# Patient Record
Sex: Female | Born: 1965 | Race: White | Hispanic: No | Marital: Married | Smoking: Never smoker
Health system: Southern US, Community
[De-identification: ages and names within clinical notes are randomized; demographics above are authoritative.]

## PROBLEM LIST (undated history)

## (undated) DIAGNOSIS — F419 Anxiety disorder, unspecified: Secondary | ICD-10-CM

## (undated) HISTORY — DX: Anxiety disorder, unspecified: F41.9

## (undated) HISTORY — PX: TOE SURGERY: SHX1073

## (undated) HISTORY — PX: CERVICAL BIOPSY: SHX590

---

## 2007-10-09 ENCOUNTER — Ambulatory Visit: Payer: Self-pay | Admitting: Obstetrics and Gynecology

## 2009-03-08 ENCOUNTER — Ambulatory Visit: Payer: Self-pay | Admitting: Obstetrics and Gynecology

## 2011-09-18 ENCOUNTER — Ambulatory Visit: Payer: Self-pay | Admitting: Obstetrics and Gynecology

## 2012-09-25 ENCOUNTER — Encounter: Payer: Self-pay | Admitting: Family Medicine

## 2012-09-25 ENCOUNTER — Ambulatory Visit (INDEPENDENT_AMBULATORY_CARE_PROVIDER_SITE_OTHER): Payer: BC Managed Care – PPO | Admitting: Family Medicine

## 2012-09-25 ENCOUNTER — Telehealth: Payer: Self-pay

## 2012-09-25 VITALS — BP 100/60 | HR 80 | Temp 97.9°F | Ht 66.25 in | Wt 171.0 lb

## 2012-09-25 DIAGNOSIS — Z136 Encounter for screening for cardiovascular disorders: Secondary | ICD-10-CM

## 2012-09-25 DIAGNOSIS — F4323 Adjustment disorder with mixed anxiety and depressed mood: Secondary | ICD-10-CM

## 2012-09-25 DIAGNOSIS — Z1231 Encounter for screening mammogram for malignant neoplasm of breast: Secondary | ICD-10-CM

## 2012-09-25 DIAGNOSIS — Z0000 Encounter for general adult medical examination without abnormal findings: Secondary | ICD-10-CM

## 2012-09-25 LAB — CBC WITH DIFFERENTIAL/PLATELET
Basophils Relative: 0.4 % (ref 0.0–3.0)
Eosinophils Relative: 0.8 % (ref 0.0–5.0)
HCT: 37.9 % (ref 36.0–46.0)
Hemoglobin: 12.9 g/dL (ref 12.0–15.0)
Lymphs Abs: 1.3 10*3/uL (ref 0.7–4.0)
MCV: 84.8 fl (ref 78.0–100.0)
Monocytes Absolute: 0.4 10*3/uL (ref 0.1–1.0)
Neutro Abs: 3.6 10*3/uL (ref 1.4–7.7)
Platelets: 273 10*3/uL (ref 150.0–400.0)
RBC: 4.47 Mil/uL (ref 3.87–5.11)
WBC: 5.3 10*3/uL (ref 4.5–10.5)

## 2012-09-25 LAB — COMPREHENSIVE METABOLIC PANEL
ALT: 19 U/L (ref 0–35)
CO2: 26 mEq/L (ref 19–32)
Calcium: 9.2 mg/dL (ref 8.4–10.5)
Chloride: 107 mEq/L (ref 96–112)
Creatinine, Ser: 0.8 mg/dL (ref 0.4–1.2)
GFR: 86.57 mL/min (ref >60.00)

## 2012-09-25 LAB — LIPID PANEL: HDL: 48.1 mg/dL (ref >39.00)

## 2012-09-25 LAB — T4, FREE: Free T4: 0.82 ng/dL (ref 0.60–1.60)

## 2012-09-25 MED ORDER — CITALOPRAM HYDROBROMIDE 20 MG PO TABS: ORAL_TABLET | ORAL | Status: DC

## 2012-09-25 NOTE — Patient Instructions (Addendum)
It was nice to meet you. Please call to set up your mammogram.  I will call you with your lab results or you can view them online.  We are starting celexa (citalopram).  Please call me with an update in a few weeks.

## 2012-09-25 NOTE — Telephone Encounter (Signed)
Okey Regal pharmacist with [facility] called to confirm pt is to d/c lexapro and start citalopram; advised carol that was correct.Okey Regal voiced understanding.

## 2012-09-25 NOTE — Progress Notes (Signed)
Subjective:    Patient ID: Misty Rogers, female    DOB: 18-Oct-1965, 47 y.o.   MRN: [id number]  HPI  Very pleasant 47 yo G2P2 here to establish care.  "Moodiness"- started on lexapro 10 mg daily last year by OBYN for being more snappy with people.  She denies any depression.  Does feel more anxious.  Stopped taking it a few weeks ago because she felt it was causing weight gain.  Also endorses sexual dysfunction.  Still having periods but they have become a little more irregular.  Has two teenagers at home.  Husband supportive.  Due for mammogram.  No h/o abnormal pap smears in past 5 years.  Last pap smear June 2013.   Patient Active Problem List   Diagnosis Date Noted  . Adjustment disorder with mixed anxiety and depressed mood 09/25/2012  . Routine general medical examination at a health care facility 09/25/2012  . Screening for ischemic heart disease 09/25/2012   Past Medical History  Diagnosis Date  . Anxiety    No past surgical history on file. History  Substance Use Topics  . Smoking status: Never Smoker   . Smokeless tobacco: Not on file  . Alcohol Use: Not on file   Family History  Problem Relation Age of Onset  . Hypertension Father   . Seizures Father   . Leukemia Father   . Cancer Maternal Aunt 60    ovarian   No Known Allergies No current outpatient prescriptions on file prior to visit.   No current facility-administered medications on file prior to visit.   The PMH, PSH, Social History, Family History, Medications, and allergies have been reviewed in [facility], and have been updated if relevant.     Review of Systems    See HPI Denies changes in her bowels Feels like she cannot get control over her weight- wants to become more active +difficulty achieving orgasm  Objective:   Physical Exam BP 100/60  Pulse 80  Temp(Src) 97.9 F (36.6 C)  Ht 5' 6.25" (1.683 m)  Wt 171 lb (77.565 kg)  BMI 27.38 kg/m2  General:   Well-developed,well-nourished,in no acute distress; alert,appropriate and cooperative throughout examination Head:  normocephalic and atraumatic.   Eyes:  vision grossly intact, pupils equal, pupils round, and pupils reactive to light.   Ears:  R ear normal and L ear normal.   Nose:  no external deformity.   Mouth:  good dentition.   Lungs:  Normal respiratory effort, chest expands symmetrically. Lungs are clear to auscultation, no crackles or wheezes. Heart:  Normal rate and regular rhythm. S1 and S2 normal without gallop, murmur, click, rub or other extra sounds. Abdomen:  Bowel sounds positive,abdomen soft and non-tender without masses, organomegaly or hernias noted. Msk:  No deformity or scoliosis noted of thoracic or lumbar spine.   Extremities:  No clubbing, cyanosis, edema, or deformity noted with normal full range of motion of all joints.   Neurologic:  alert & oriented X3 and gait normal.   Skin:  Intact without suspicious lesions or rashes Cervical Nodes:  No lymphadenopathy noted Axillary Nodes:  No palpable lymphadenopathy Psych:  Cognition and judgment appear intact. Alert and cooperative with normal attention span and concentration. No apparent delusions, illusions, hallucinations     Assessment & Plan:  1. Adjustment disorder with mixed anxiety and depressed mood Discussed work up and different tx options. Any SSRI has potential to cause weight gain and or sexual dysfunction but we can certainly try another medication.  She already stopped lexapro.  Will start citalopram 20 mg daily. Will also check labs today to rule out other possible contributing factors.  - Luteinizing hormone - Follicle Stimulating Hormone - TSH - T4, Free - Comprehensive metabolic panel - CBC with Differential  2. Screening for ischemic heart disease  - Lipid Panel  3. Other screening mammogram  - MM Digital Screening; Future

## 2012-09-26 ENCOUNTER — Other Ambulatory Visit: Payer: Self-pay | Admitting: Family Medicine

## 2012-09-26 MED ORDER — LEVOTHYROXINE SODIUM 25 MCG PO TABS: 25.0000 ug | ORAL_TABLET | Freq: Every day | ORAL | Status: DC

## 2012-10-07 ENCOUNTER — Ambulatory Visit: Payer: Self-pay | Admitting: Family Medicine

## 2012-10-08 ENCOUNTER — Encounter: Payer: Self-pay | Admitting: Family Medicine

## 2012-10-09 ENCOUNTER — Encounter: Payer: Self-pay | Admitting: *Deleted

## 2012-10-22 ENCOUNTER — Ambulatory Visit: Payer: Self-pay | Admitting: Family Medicine

## 2012-10-23 ENCOUNTER — Other Ambulatory Visit: Payer: BC Managed Care – PPO

## 2012-10-23 ENCOUNTER — Other Ambulatory Visit (INDEPENDENT_AMBULATORY_CARE_PROVIDER_SITE_OTHER): Payer: BC Managed Care – PPO

## 2012-10-23 DIAGNOSIS — R946 Abnormal results of thyroid function studies: Secondary | ICD-10-CM

## 2012-10-23 DIAGNOSIS — R7989 Other specified abnormal findings of blood chemistry: Secondary | ICD-10-CM

## 2012-10-28 ENCOUNTER — Encounter: Payer: Self-pay | Admitting: *Deleted

## 2012-10-28 ENCOUNTER — Other Ambulatory Visit: Payer: Self-pay | Admitting: *Deleted

## 2012-10-28 MED ORDER — LEVOTHYROXINE SODIUM 25 MCG PO TABS: 25.0000 ug | ORAL_TABLET | Freq: Every day | ORAL | Status: DC

## 2013-01-05 ENCOUNTER — Telehealth: Payer: Self-pay | Admitting: *Deleted

## 2013-01-05 NOTE — Telephone Encounter (Signed)
Yes, change to 1 tab daily, # 30, 2 refills please

## 2013-01-05 NOTE — Telephone Encounter (Signed)
Received faxed refill request from pharmacy for Citalopram 20 mg, should script be changed to one tablet daily? Last office visit 09/25/12. Is it okay to refill medication?

## 2013-01-06 MED ORDER — CITALOPRAM HYDROBROMIDE 20 MG PO TABS: ORAL_TABLET | ORAL | Status: DC

## 2013-01-06 NOTE — Telephone Encounter (Signed)
Refill sent to pharmacy as instructed. 

## 2013-05-06 ENCOUNTER — Other Ambulatory Visit: Payer: Self-pay | Admitting: Internal Medicine

## 2013-05-06 NOTE — Telephone Encounter (Signed)
Last filled 12/2012--pleaser advise

## 2013-05-07 NOTE — Telephone Encounter (Signed)
Dr. A pt 

## 2013-06-08 ENCOUNTER — Encounter: Payer: Self-pay | Admitting: Family Medicine

## 2013-06-08 ENCOUNTER — Ambulatory Visit (INDEPENDENT_AMBULATORY_CARE_PROVIDER_SITE_OTHER): Payer: BC Managed Care – PPO | Admitting: Family Medicine

## 2013-06-08 VITALS — BP 106/68 | HR 67 | Temp 98.0°F | Ht 66.0 in | Wt 170.5 lb

## 2013-06-08 DIAGNOSIS — M722 Plantar fascial fibromatosis: Secondary | ICD-10-CM

## 2013-06-08 NOTE — Progress Notes (Signed)
Pre visit review using our clinic review tool, if applicable. No additional management support is needed unless otherwise documented below in the visit note. 

## 2013-06-08 NOTE — Patient Instructions (Signed)
Please read handouts on Plantar Fascitis.  STRETCHING and Strengthening program critically important.  Strengthening on foot and calf muscles as seen in handout. Calf raises, 2 legged, then 1 legged. Foot massage with tennis ball. Ice massage.  NEEDS TO BE DONE EVERY DAY  Recommended over the counter insoles. (Spenco or Hapad)  A rigid shoe with good arch support helps: Dansko (great), Jennet Maduro, Merrell No easily bendable shoes.

## 2013-06-08 NOTE — Progress Notes (Signed)
Date:  06/08/2013   Name:  Misty Rogers   DOB:  22-Dec-1965   MRN:  [id number]  PCP:  Arnette Norris, MD   This 48 y.o. female patient presents with a 1 year long history of R heel pain. This is notable for worsening pain first thing in the morning when arising and standing after sitting.   R sided heel pain - 1 year.  No injury.  Bothers with getting up or walking a lot.   Prior foot or ankle fractures: none Prior operations: none Orthotics or bracing: none Medications: none PT or home rehab: none Night splints: no Ice massage: no Ball massage: no  Metatarsal pain: no  The PMH, PSH, Social History, Family History, Medications, and allergies have been reviewed in [facility], and have been updated if relevant.  REVIEW OF SYSTEMS  GEN: No fevers, chills. Nontoxic. Primarily MSK c/o today. MSK: Detailed in the HPI GI: tolerating PO intake without difficulty Neuro: No numbness, parasthesias, or tingling associated. Otherwise the pertinent positives of the ROS are noted above.   PHYSICAL EXAM  Blood pressure 106/68, pulse 67, temperature 98 F (36.7 C), temperature source Oral, height 5\' 6"  (1.676 m), weight 170 lb 8 oz (77.338 kg), SpO2 97.00%.  GEN: Well-developed,well-nourished,in no acute distress; alert,appropriate and cooperative throughout examination HEENT: Normocephalic and atraumatic without obvious abnormalities. Ears, externally no deformities PULM: Breathing comfortably in no respiratory distress EXT: No clubbing, cyanosis, or edema PSYCH: Normally interactive. Cooperative during the interview. Pleasant. Friendly and conversant. Not anxious or depressed appearing. Normal, full affect.  R Echymosis: no Edema: no ROM: full LE B Gait: heel toe, non-antalgic MT pain: no Callus pattern: none Lateral Mall: NT Medial Mall: NT Talus: NT Navicular: NT Calcaneous: NT Metatarsals: NT 5th MT: NT Phalanges: NT Achilles: NT Plantar Fascia: tender, medial along PF. Pain  with forced dorsi Fat Pad: NT Peroneals: NT Post Tib: NT Great Toe: Nml motion Ant Drawer: neg Other foot breakdown: none Long arch: natural cavus foot with some breakdown now Transverse arch: bunion and bunionette formation, notable breakdown B Hindfoot breakdown: none Sensation: intact  A/P: Plantar fascitis: R  >25 minutes spent in face to face time with patient, >50% spent in counselling or coordination of care  We reviewed that stretching is critically important to the treatment of PF. Reviewed footwear. Rigid soles have been shown to help with PF.  Reviewed rehab of stretching and calf raises.  Reviewed rehab from [facility]  Could benefit from a corticosteroid injection if conservative treatment fails.  New Prescriptions   No medications on file    No orders of the defined types were placed in this encounter.    Patient Instructions: Please read handouts on Plantar Fascitis.  STRETCHING and Strengthening program critically important.  Strengthening on foot and calf muscles as seen in handout. Calf raises, 2 legged, then 1 legged. Foot massage with tennis ball. Ice massage.  NEEDS TO BE DONE EVERY DAY  Recommended over the counter insoles. (Spenco or Hapad)  A rigid shoe with good arch support helps: Dansko (great), Jennet Maduro, Merrell No easily bendable shoes.    Signed,  Maud Deed. Brayton Baumgartner, MD, [facility] [place] [address] Phone: [phone] Fax: [phone]   Patient's Medications  New Prescriptions   No medications on file  Previous Medications   CITALOPRAM (CELEXA) 20 MG TABLET    TAKE 1 TABLET BY MOUTH ONCE  DAILY.   LEVOTHYROXINE (SYNTHROID, LEVOTHROID) 25 MCG TABLET    Take 1 tablet (25 mcg total) by mouth daily before breakfast.  Modified Medications   No medications on file  Discontinued Medications   No medications on file

## 2013-12-21 ENCOUNTER — Other Ambulatory Visit: Payer: Self-pay

## 2013-12-21 MED ORDER — CITALOPRAM HYDROBROMIDE 20 MG PO TABS: ORAL_TABLET | ORAL | Status: DC

## 2013-12-21 NOTE — Telephone Encounter (Signed)
Pt left /vm requesting refill citalopram to [facility]; pt last seen by Dr Deborra Medina 09/25/12 and Dr Lorelei Pont saw pt plantar fasciitis on 06/08/13. Pt has appt scheduled on 01/20/14.Please advise.

## 2014-01-20 ENCOUNTER — Ambulatory Visit (INDEPENDENT_AMBULATORY_CARE_PROVIDER_SITE_OTHER): Payer: BC Managed Care – PPO | Admitting: Family Medicine

## 2014-01-20 ENCOUNTER — Encounter: Payer: Self-pay | Admitting: Family Medicine

## 2014-01-20 ENCOUNTER — Other Ambulatory Visit (HOSPITAL_COMMUNITY)
Admission: RE | Admit: 2014-01-20 | Discharge: 2014-01-20 | Disposition: A | Discharge status: Home / Self Care | Payer: BC Managed Care – PPO | Source: Ambulatory Visit | Attending: Family Medicine | Admitting: Family Medicine

## 2014-01-20 VITALS — BP 118/68 | HR 67 | Temp 98.1°F | Ht 66.25 in | Wt 177.8 lb

## 2014-01-20 DIAGNOSIS — Z1151 Encounter for screening for human papillomavirus (HPV): Secondary | ICD-10-CM | POA: Insufficient documentation

## 2014-01-20 DIAGNOSIS — F4323 Adjustment disorder with mixed anxiety and depressed mood: Secondary | ICD-10-CM

## 2014-01-20 DIAGNOSIS — Z01419 Encounter for gynecological examination (general) (routine) without abnormal findings: Secondary | ICD-10-CM | POA: Diagnosis not present

## 2014-01-20 DIAGNOSIS — E039 Hypothyroidism, unspecified: Secondary | ICD-10-CM | POA: Insufficient documentation

## 2014-01-20 DIAGNOSIS — Z0000 Encounter for general adult medical examination without abnormal findings: Secondary | ICD-10-CM

## 2014-01-20 DIAGNOSIS — Z1239 Encounter for other screening for malignant neoplasm of breast: Secondary | ICD-10-CM

## 2014-01-20 LAB — T4, FREE: Free T4: 0.78 ng/dL (ref 0.60–1.60)

## 2014-01-20 LAB — COMPREHENSIVE METABOLIC PANEL
ALBUMIN: 4.1 g/dL (ref 3.5–5.2)
ALT: 19 U/L (ref 0–35)
AST: 21 U/L (ref 0–37)
Alkaline Phosphatase: 60 U/L (ref 39–117)
BUN: 11 mg/dL (ref 6–23)
CO2: 24 meq/L (ref 19–32)
Calcium: 9.1 mg/dL (ref 8.4–10.5)
Chloride: 103 mEq/L (ref 96–112)
Creatinine, Ser: 0.8 mg/dL (ref 0.4–1.2)
GFR: 83.55 mL/min (ref >60.00)
GLUCOSE: 76 mg/dL (ref 70–99)
POTASSIUM: 3.8 meq/L (ref 3.5–5.1)
SODIUM: 138 meq/L (ref 135–145)
TOTAL PROTEIN: 7.1 g/dL (ref 6.0–8.3)
Total Bilirubin: 0.6 mg/dL (ref 0.2–1.2)

## 2014-01-20 LAB — CBC WITH DIFFERENTIAL/PLATELET
Basophils Absolute: 0 10*3/uL (ref 0.0–0.1)
Basophils Relative: 0.3 % (ref 0.0–3.0)
EOS PCT: 0.5 % (ref 0.0–5.0)
Eosinophils Absolute: 0 10*3/uL (ref 0.0–0.7)
HEMATOCRIT: 38.8 % (ref 36.0–46.0)
Hemoglobin: 12.9 g/dL (ref 12.0–15.0)
Lymphocytes Relative: 29.2 % (ref 12.0–46.0)
Lymphs Abs: 1.5 10*3/uL (ref 0.7–4.0)
MCHC: 33.2 g/dL (ref 30.0–36.0)
MCV: 85.5 fl (ref 78.0–100.0)
Monocytes Absolute: 0.4 10*3/uL (ref 0.1–1.0)
Monocytes Relative: 8.3 % (ref 3.0–12.0)
NEUTROS PCT: 61.7 % (ref 43.0–77.0)
Neutro Abs: 3.2 10*3/uL (ref 1.4–7.7)
PLATELETS: 316 10*3/uL (ref 150.0–400.0)
RBC: 4.53 Mil/uL (ref 3.87–5.11)
RDW: 13.2 % (ref 11.5–15.5)
WBC: 5.2 10*3/uL (ref 4.0–10.5)

## 2014-01-20 LAB — LIPID PANEL
CHOLESTEROL: 201 mg/dL — AB (ref 0–200)
HDL: 45.9 mg/dL (ref >39.00)
LDL Cholesterol: 124 mg/dL — ABNORMAL HIGH (ref 0–99)
NonHDL: 155.1
Total CHOL/HDL Ratio: 4
Triglycerides: 155 mg/dL — ABNORMAL HIGH (ref 0.0–149.0)
VLDL: 31 mg/dL (ref 0.0–40.0)

## 2014-01-20 LAB — TSH: TSH: 2.97 u[IU]/mL (ref 0.35–4.50)

## 2014-01-20 MED ORDER — LEVOTHYROXINE SODIUM 25 MCG PO TABS: 25.0000 ug | ORAL_TABLET | Freq: Every day | ORAL | Status: DC

## 2014-01-20 NOTE — Progress Notes (Signed)
Pre visit review using our clinic review tool, if applicable. No additional management support is needed unless otherwise documented below in the visit note. 

## 2014-01-20 NOTE — Assessment & Plan Note (Signed)
Well controlled on Celexa. No changes made today.

## 2014-01-20 NOTE — Patient Instructions (Signed)
Great to see you. Happy Thanksgiving. We will call you with your results from today.  Please call to set up your mammogram.

## 2014-01-20 NOTE — Addendum Note (Signed)
Addended by: Modena Nunnery on: 01/20/2014 10:40 AM   Modules accepted: Orders

## 2014-01-20 NOTE — Assessment & Plan Note (Signed)
Recheck thyroid function today.

## 2014-01-20 NOTE — Progress Notes (Signed)
Subjective:   Patient ID: Misty Rogers, female    DOB: May 03, 1965, 48 y.o.   MRN: [id number]  Misty Rogers is a pleasant 48 y.o. year old female who I have not seen since she established care with me in 08/2012,  presents to clinic today with Annual Exam  on 01/20/2014  HPI:   No h/o abnormal pap smears in past 5 years. Last pap smear June 2013. Last mammogram 10/07/12. Has been having irregular periods. Has already received flu shot.  When she established care with me last summer, she was having symptoms of anxiety.  Had stopped taking Lexapro prescribed by her GYN due to weight gain and sexual dysfunction.  We started her on celexa 20 mg daily. Has not noticed those symptoms on Celexa. Wt Readings from Last 3 Encounters:  01/20/14 177 lb 12 oz (80.627 kg)  06/08/13 170 lb 8 oz (77.338 kg)  09/25/12 171 lb (77.565 kg)    Hypothyroidism- supposed to take synthroid 25 mcg daily but often forgets dose. Has noticed some fatigue but she thinks that is more due to perimenopause. Lab Results  Component Value Date   TSH 2.27 10/23/2012    Lab Results  Component Value Date   CHOL 182 09/25/2012   HDL 48.10 09/25/2012   LDLCALC 110* 09/25/2012   TRIG 118.0 09/25/2012   CHOLHDL 4 09/25/2012   Lab Results  Component Value Date   WBC 5.3 09/25/2012   HGB 12.9 09/25/2012   HCT 37.9 09/25/2012   MCV 84.8 09/25/2012   PLT 273.0 09/25/2012   Lab Results  Component Value Date   CREATININE 0.8 09/25/2012   Lab Results  Component Value Date   TSH 2.27 10/23/2012    Current Outpatient Prescriptions on File Prior to Visit  Medication Sig Dispense Refill  . citalopram (CELEXA) 20 MG tablet TAKE 1 TABLET BY MOUTH ONCE DAILY. 30 tablet 0   No current facility-administered medications on file prior to visit.    No Known Allergies  Past Medical History  Diagnosis Date  . Anxiety     No past surgical history on file.  Family History  Problem Relation Age of Onset    . Hypertension Father   . Seizures Father   . Leukemia Father   . Cancer Maternal Aunt 60    ovarian    History   Social History  . Marital Status: Married    Spouse Name: N/A    Number of Children: N/A  . Years of Education: N/A   Occupational History  . Not on file.   Social History Main Topics  . Smoking status: Never Smoker   . Smokeless tobacco: Not on file  . Alcohol Use: Not on file  . Drug Use: Not on file  . Sexual Activity: Not on file   Other Topics Concern  . Not on file   Social History Narrative   Teacher   Married, two children.   The PMH, PSH, Social History, Family History, Medications, and allergies have been reviewed in [facility], and have been updated if relevant.  Review of Systems See HPI Patient reports no  vision/ hearing changes,anorexia, weight change, fever ,adenopathy, persistant / recurrent hoarseness, swallowing issues, chest pain, edema,persistant / recurrent cough, hemoptysis, dyspnea(rest, exertional, paroxysmal nocturnal), gastrointestinal  bleeding (melena, rectal bleeding), abdominal pain, excessive heart burn, GU symptoms(dysuria, hematuria, pyuria, voiding/incontinence  Issues) syncope, focal weakness, severe memory loss, concerning skin lesions, depression, anxiety, abnormal bruising/bleeding, major joint swelling, breast masses or abnormal vaginal  bleeding.       Objective:    BP 118/68 mmHg  Pulse 67  Temp(Src) 98.1 F (36.7 C) (Oral)  Ht 5' 6.25" (1.683 m)  Wt 177 lb 12 oz (80.627 kg)  BMI 28.47 kg/m2  SpO2 99%  LMP 12/30/2013 (Within Days)   Physical Exam    General:  Well-developed,well-nourished,in no acute distress; alert,appropriate and cooperative throughout examination Head:  normocephalic and atraumatic.   Eyes:  vision grossly intact, pupils equal, pupils round, and pupils reactive to light.   Ears:  R ear normal and L ear normal.   Nose:  no external deformity.   Mouth:  good dentition.   Neck:  No  deformities, masses, or tenderness noted. Breasts:  No mass, nodules, thickening, tenderness, bulging, retraction, inflamation, nipple discharge or skin changes noted.   Lungs:  Normal respiratory effort, chest expands symmetrically. Lungs are clear to auscultation, no crackles or wheezes. Heart:  Normal rate and regular rhythm. S1 and S2 normal without gallop, murmur, click, rub or other extra sounds. Abdomen:  Bowel sounds positive,abdomen soft and non-tender without masses, organomegaly or hernias noted. Rectal:  no external abnormalities.   Genitalia:  Pelvic Exam:        External: normal female genitalia without lesions or masses        Vagina: normal without lesions or masses        Cervix: normal without lesions or masses        Adnexa: normal bimanual exam without masses or fullness        Uterus: normal by palpation        Pap smear: performed Msk:  No deformity or scoliosis noted of thoracic or lumbar spine.   Extremities:  No clubbing, cyanosis, edema, or deformity noted with normal full range of motion of all joints.   Neurologic:  alert & oriented X3 and gait normal.   Skin:  Intact without suspicious lesions or rashes Cervical Nodes:  No lymphadenopathy noted Axillary Nodes:  No palpable lymphadenopathy Psych:  Cognition and judgment appear intact. Alert and cooperative with normal attention span and concentration. No apparent delusions, illusions, hallucinations      Assessment & Plan:   Routine general medical examination at a health care facility  Hypothyroidism, unspecified hypothyroidism type No Follow-up on file.

## 2014-01-20 NOTE — Assessment & Plan Note (Signed)
Reviewed preventive care protocols, scheduled due services, and updated immunizations Discussed nutrition, exercise, diet, and healthy lifestyle.  Pap smear done today.  Orders Placed This Encounter  Procedures  . MM Digital Screening  . CBC with Differential  . Comprehensive metabolic panel  . Lipid panel  . TSH  . T4, Free

## 2014-01-25 ENCOUNTER — Encounter: Payer: Self-pay | Admitting: *Deleted

## 2014-01-25 LAB — CYTOLOGY - PAP

## 2014-01-27 ENCOUNTER — Other Ambulatory Visit: Payer: Self-pay | Admitting: *Deleted

## 2014-01-27 MED ORDER — CITALOPRAM HYDROBROMIDE 20 MG PO TABS: ORAL_TABLET | ORAL | Status: DC

## 2014-03-03 ENCOUNTER — Ambulatory Visit (INDEPENDENT_AMBULATORY_CARE_PROVIDER_SITE_OTHER): Payer: BC Managed Care – PPO | Admitting: Family Medicine

## 2014-03-03 ENCOUNTER — Encounter: Payer: Self-pay | Admitting: Family Medicine

## 2014-03-03 VITALS — BP 100/70 | HR 69 | Temp 98.8°F | Wt 180.5 lb

## 2014-03-03 DIAGNOSIS — J069 Acute upper respiratory infection, unspecified: Secondary | ICD-10-CM

## 2014-03-03 DIAGNOSIS — J029 Acute pharyngitis, unspecified: Secondary | ICD-10-CM

## 2014-03-03 LAB — POCT RAPID STREP A (OFFICE): Rapid Strep A Screen: NEGATIVE

## 2014-03-03 MED ORDER — FLUTICASONE PROPIONATE 50 MCG/ACT NA SUSP: 2.0000 | Freq: Every day | NASAL | Status: DC

## 2014-03-03 MED ORDER — BENZONATATE 200 MG PO CAPS: 200.0000 mg | ORAL_CAPSULE | Freq: Three times a day (TID) | ORAL | Status: DC | PRN

## 2014-03-03 NOTE — Patient Instructions (Signed)
Gargle with salt water, use tessalon for the cough and use flonase for the runny nose.  Gently try to pop your ears.  Take care. Glad to see you.

## 2014-03-03 NOTE — Progress Notes (Signed)
Sx started about 8 days ago.  Cough initially.  Malaise.  ST.  HA.  Fatigue.  ST continues now.  Ear pressure.  Sleep disrupted.  Cough at night.  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   ST HA and nocturnal cough are most bothersome.  HA is frontal, not maxillary.  Bilateral.    Meds, vitals, and allergies reviewed.   ROS: See HPI.  Otherwise, noncontributory.  GEN: nad, alert and oriented HEENT: mucous membranes moist, tm w/o erythema, nasal exam w/o erythema, clear discharge noted,  OP with cobblestoning, sinuses not ttp x4 NECK: supple w/o LA CV: rrr.   PULM: ctab, no inc wob EXT: no edema SKIN: no acute rash L ETD on exam.  She is able to clear the R TM.   RST neg.

## 2014-03-04 ENCOUNTER — Ambulatory Visit: Payer: Self-pay | Admitting: Internal Medicine

## 2014-03-04 DIAGNOSIS — J069 Acute upper respiratory infection, unspecified: Secondary | ICD-10-CM | POA: Insufficient documentation

## 2014-03-04 NOTE — Assessment & Plan Note (Signed)
Likely viral, dw pt.  Use tessalon for cough, add on flonase. F/u prn.  She agrees.

## 2014-04-27 ENCOUNTER — Other Ambulatory Visit: Payer: Self-pay | Admitting: *Deleted

## 2014-04-27 MED ORDER — CITALOPRAM HYDROBROMIDE 20 MG PO TABS: ORAL_TABLET | ORAL | Status: DC

## 2014-06-25 ENCOUNTER — Other Ambulatory Visit: Payer: Self-pay | Admitting: Family Medicine

## 2014-06-25 DIAGNOSIS — Z1231 Encounter for screening mammogram for malignant neoplasm of breast: Secondary | ICD-10-CM

## 2014-06-29 ENCOUNTER — Ambulatory Visit (INDEPENDENT_AMBULATORY_CARE_PROVIDER_SITE_OTHER): Payer: BC Managed Care – PPO | Admitting: Family Medicine

## 2014-06-29 ENCOUNTER — Encounter: Payer: Self-pay | Admitting: Family Medicine

## 2014-06-29 ENCOUNTER — Ambulatory Visit: Payer: BC Managed Care – PPO | Admitting: Family Medicine

## 2014-06-29 VITALS — BP 122/82 | HR 68 | Temp 98.0°F | Wt 185.8 lb

## 2014-06-29 DIAGNOSIS — B029 Zoster without complications: Secondary | ICD-10-CM

## 2014-06-29 MED ORDER — VALACYCLOVIR HCL 1 G PO TABS: 1000.0000 mg | ORAL_TABLET | Freq: Three times a day (TID) | ORAL | Status: DC

## 2014-06-29 NOTE — Progress Notes (Signed)
Subjective:   Patient ID: Misty Rogers, female    DOB: August 09, 1965, 49 y.o.   MRN: [id number]  Misty Rogers is a pleasant 49 y.o. year old female who presents to clinic today with Follow-up  on 06/29/2014  HPI:  End of last week, felt some sharp pain lateral border of her left breast.  She thought maybe she pulled something.  On Saturday, in same area and wrapped around to her left back, developed blistery rash. Went to [employer] in Clinic- diagnosed with shingles.  Brings in bottles of rxs she was given.  Given Valtrex 1 gram twice daily x 7 days along with Tramadol.  Has not noticed any weeping.  Still painful and burning. No fevers.  Tries not to take Tramadol since she still has to work and it made her sleepy.  Has never had shingles in past.  Does not feel like she has new stressors in her life, has not been recently sick.  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  Lab Results  Component Value Date   WBC 5.2 01/20/2014   HGB 12.9 01/20/2014   HCT 38.8 01/20/2014   MCV 85.5 01/20/2014   PLT 316.0 01/20/2014   Current Outpatient Prescriptions on File Prior to Visit  Medication Sig Dispense Refill  . citalopram (CELEXA) 20 MG tablet TAKE 1 TABLET BY MOUTH ONCE DAILY. 30 tablet 7   No current facility-administered medications on file prior to visit.    No Known Allergies  Past Medical History  Diagnosis Date  . Anxiety     No past surgical history on file.  Family History  Problem Relation Age of Onset  . Hypertension Father   . Seizures Father   . Leukemia Father   . Cancer Maternal Aunt 60    ovarian    History   Social History  . Marital Status: Married    Spouse Name: N/A  . Number of Children: N/A  . Years of Education: N/A   Occupational History  . Not on file.   Social History Main Topics  . Smoking status: Never Smoker   . Smokeless tobacco: Never Used  . Alcohol Use: No  . Drug Use: No  . Sexual  Activity: Not on file   Other Topics Concern  . Not on file   Social History Narrative   Teacher   Married, two children.   The PMH, PSH, Social History, Family History, Medications, and allergies have been reviewed in [facility], and have been updated if relevant.   Review of Systems  Constitutional: Negative.   HENT: Negative.   Respiratory: Negative.   Cardiovascular: Negative.   Gastrointestinal: Negative.   Skin: Negative.   Neurological: Negative.   Hematological: Negative.   Psychiatric/Behavioral: Negative.   All other systems reviewed and are negative.      Objective:    BP 122/82 mmHg  Pulse 68  Temp(Src) 98 F (36.7 C) (Oral)  Wt 185 lb 12 oz (84.256 kg)  SpO2 98%   Physical Exam  Constitutional: She is oriented to person, place, and time. She appears well-developed and well-nourished. No distress.  HENT:  Head: Normocephalic.  Eyes: Conjunctivae are normal.  Cardiovascular: Normal rate.   Pulmonary/Chest: Effort normal.  Musculoskeletal: She exhibits no edema.  Neurological: She is alert and oriented to person, place, and time. No cranial nerve deficit.  Skin: Skin is warm and dry.     Psychiatric: She has a normal  mood and affect. Her behavior is normal. Judgment and thought content normal.  Nursing note and vitals reviewed.         Assessment & Plan:   Herpes zoster No Follow-up on file.

## 2014-06-29 NOTE — Progress Notes (Signed)
Pre visit review using our clinic review tool, if applicable. No additional management support is needed unless otherwise documented below in the visit note. 

## 2014-06-29 NOTE — Patient Instructions (Signed)

## 2014-06-29 NOTE — Assessment & Plan Note (Signed)
>  25 minutes spent in face to face time with patient, >50% spent in counselling or coordination of care Agree with diagnosis of shingles but I prefer to increase her valtrex to 1 gram three times daily for the remainder of her course. Call or return to clinic prn if these symptoms worsen or fail to improve as anticipated. The patient indicates understanding of these issues and agrees with the plan.

## 2014-07-13 ENCOUNTER — Ambulatory Visit
Admission: RE | Admit: 2014-07-13 | Discharge: 2014-07-13 | Disposition: A | Discharge status: Home / Self Care | Payer: BC Managed Care – PPO | Source: Ambulatory Visit | Attending: Family Medicine | Admitting: Family Medicine

## 2014-07-13 DIAGNOSIS — Z1231 Encounter for screening mammogram for malignant neoplasm of breast: Secondary | ICD-10-CM

## 2014-11-29 ENCOUNTER — Encounter: Payer: Self-pay | Admitting: Family Medicine

## 2014-11-29 ENCOUNTER — Ambulatory Visit (INDEPENDENT_AMBULATORY_CARE_PROVIDER_SITE_OTHER): Payer: BC Managed Care – PPO | Admitting: Family Medicine

## 2014-11-29 VITALS — BP 132/62 | HR 67 | Temp 97.6°F | Wt 191.2 lb

## 2014-11-29 DIAGNOSIS — Z23 Encounter for immunization: Secondary | ICD-10-CM | POA: Diagnosis not present

## 2014-11-29 DIAGNOSIS — N951 Menopausal and female climacteric states: Secondary | ICD-10-CM | POA: Diagnosis not present

## 2014-11-29 NOTE — Progress Notes (Signed)
   Subjective:   Patient ID: Misty Rogers, female    DOB: 1966-01-28, 49 y.o.   MRN: [id number]  Misty Rogers is a pleasant 49 y.o. year old female who presents to clinic today with Hot Flashes and [place]  on 11/29/2014  HPI:  Has not had a period in 10 months.  Over past 10 months, progressively more frequent and more severe hot flashes.  Her face and neck get red and warm and she "breaks out in all over sweat."  She is taking her celexa regularly.  Not yet having issues with vaginal dryness.  She is having some insomnia, no mood swings.  Current Outpatient Prescriptions on File Prior to Visit  Medication Sig Dispense Refill  . citalopram (CELEXA) 20 MG tablet TAKE 1 TABLET BY MOUTH ONCE DAILY. 30 tablet 7   No current facility-administered medications on file prior to visit.    No Known Allergies  Past Medical History  Diagnosis Date  . Anxiety     History reviewed. No pertinent past surgical history.  Family History  Problem Relation Age of Onset  . Hypertension Father   . Seizures Father   . Leukemia Father   . Cancer Maternal Aunt 2    ovarian    Social History   Social History  . Marital Status: Married    Spouse Name: N/A  . Number of Children: N/A  . Years of Education: N/A   Occupational History  . Not on file.   Social History Main Topics  . Smoking status: Never Smoker   . Smokeless tobacco: Never Used  . Alcohol Use: No  . Drug Use: No  . Sexual Activity: Not on file   Other Topics Concern  . Not on file   Social History Narrative   Teacher   Married, two children.   The PMH, PSH, Social History, Family History, Medications, and allergies have been reviewed in [facility], and have been updated if relevant.   Review of Systems  Gastrointestinal: Negative.   Endocrine: Positive for heat intolerance.  Genitourinary: Positive for menstrual problem. Negative for dysuria, urgency, hematuria, decreased urine volume, vaginal bleeding,  vaginal discharge, vaginal pain, pelvic pain and dyspareunia.  Neurological: Negative.   Psychiatric/Behavioral: Positive for sleep disturbance. Negative for suicidal ideas, hallucinations, behavioral problems, confusion, self-injury, dysphoric mood, decreased concentration and agitation. The patient is not nervous/anxious and is not hyperactive.   All other systems reviewed and are negative.      Objective:    BP 132/62 mmHg  Pulse 67  Temp(Src) 97.6 F (36.4 C) (Oral)  Wt 191 lb 4 oz (86.75 kg)  SpO2 98%   Physical Exam  Constitutional: She is oriented to person, place, and time. She appears well-developed and well-nourished. No distress.  HENT:  Head: Normocephalic and atraumatic.  Cardiovascular: Normal rate.   Pulmonary/Chest: Effort normal.  Musculoskeletal: Normal range of motion.  Neurological: She is alert and oriented to person, place, and time. No cranial nerve deficit.  Skin: Skin is warm and dry.  Psychiatric: She has a normal mood and affect. Her behavior is normal. Judgment and thought content normal.  Nursing note and vitals reviewed.         Assessment & Plan:   Need for influenza vaccination - Plan: Flu Vaccine QUAD 36+ mos PF IM (Fluarix & Fluzone Quad PF)  Peri-menopause No Follow-up on file.

## 2014-11-29 NOTE — Assessment & Plan Note (Signed)
New- >25 minutes spent in face to face time with patient, >50% spent in counselling or coordination of care Discussed tx options.  At this point, we agreed to check FSH/LH levels to see if she is menopausal.  She wants to know these results before considering rxs. She is already taking celexa, we could certainly try another SSRI ( wean off celexa) for vasomotor symptoms and eventually possible consider HRT. The patient indicates understanding of these issues and agrees with the plan.

## 2014-11-29 NOTE — Progress Notes (Signed)
Pre visit review using our clinic review tool, if applicable. No additional management support is needed unless otherwise documented below in the visit note. 

## 2014-11-30 ENCOUNTER — Telehealth: Payer: Self-pay | Admitting: Family Medicine

## 2014-11-30 LAB — LUTEINIZING HORMONE: LH: 32.9 m[IU]/mL

## 2014-11-30 LAB — TSH: TSH: 6.19 u[IU]/mL — AB (ref 0.35–4.50)

## 2014-11-30 LAB — FOLLICLE STIMULATING HORMONE: FSH: 63.8 m[IU]/mL

## 2014-11-30 NOTE — Telephone Encounter (Signed)
Patient returned Waynetta's call. °

## 2014-12-01 ENCOUNTER — Other Ambulatory Visit: Payer: Self-pay | Admitting: Family Medicine

## 2014-12-01 MED ORDER — LEVOTHYROXINE SODIUM 25 MCG PO TABS: 25.0000 ug | ORAL_TABLET | Freq: Every day | ORAL | Status: DC

## 2014-12-01 NOTE — Telephone Encounter (Signed)
See results note. 

## 2014-12-22 ENCOUNTER — Other Ambulatory Visit: Payer: Self-pay | Admitting: *Deleted

## 2014-12-22 ENCOUNTER — Other Ambulatory Visit: Payer: Self-pay | Admitting: Family Medicine

## 2014-12-22 DIAGNOSIS — Z01419 Encounter for gynecological examination (general) (routine) without abnormal findings: Secondary | ICD-10-CM | POA: Insufficient documentation

## 2014-12-22 DIAGNOSIS — Z136 Encounter for screening for cardiovascular disorders: Secondary | ICD-10-CM

## 2014-12-22 DIAGNOSIS — E039 Hypothyroidism, unspecified: Secondary | ICD-10-CM

## 2014-12-22 MED ORDER — CITALOPRAM HYDROBROMIDE 20 MG PO TABS: ORAL_TABLET | ORAL | Status: DC

## 2014-12-30 ENCOUNTER — Other Ambulatory Visit: Payer: BC Managed Care – PPO

## 2015-01-12 ENCOUNTER — Other Ambulatory Visit: Payer: BC Managed Care – PPO

## 2015-01-13 ENCOUNTER — Other Ambulatory Visit: Payer: BC Managed Care – PPO

## 2015-01-26 ENCOUNTER — Other Ambulatory Visit (INDEPENDENT_AMBULATORY_CARE_PROVIDER_SITE_OTHER): Payer: BC Managed Care – PPO

## 2015-01-26 DIAGNOSIS — E039 Hypothyroidism, unspecified: Secondary | ICD-10-CM | POA: Diagnosis not present

## 2015-01-26 DIAGNOSIS — Z0000 Encounter for general adult medical examination without abnormal findings: Secondary | ICD-10-CM | POA: Diagnosis not present

## 2015-01-26 DIAGNOSIS — Z01419 Encounter for gynecological examination (general) (routine) without abnormal findings: Secondary | ICD-10-CM

## 2015-01-26 DIAGNOSIS — Z136 Encounter for screening for cardiovascular disorders: Secondary | ICD-10-CM | POA: Diagnosis not present

## 2015-01-27 LAB — COMPREHENSIVE METABOLIC PANEL
ALK PHOS: 97 U/L (ref 39–117)
ALT: 44 U/L — ABNORMAL HIGH (ref 0–35)
AST: 23 U/L (ref 0–37)
Albumin: 4.2 g/dL (ref 3.5–5.2)
BUN: 18 mg/dL (ref 6–23)
CHLORIDE: 101 meq/L (ref 96–112)
CO2: 28 mEq/L (ref 19–32)
Calcium: 9.4 mg/dL (ref 8.4–10.5)
Creatinine, Ser: 0.86 mg/dL (ref 0.40–1.20)
GFR: 74.33 mL/min (ref >60.00)
GLUCOSE: 82 mg/dL (ref 70–99)
POTASSIUM: 4 meq/L (ref 3.5–5.1)
Sodium: 138 mEq/L (ref 135–145)
TOTAL PROTEIN: 7.3 g/dL (ref 6.0–8.3)
Total Bilirubin: 0.3 mg/dL (ref 0.2–1.2)

## 2015-01-27 LAB — CBC WITH DIFFERENTIAL/PLATELET
BASOS PCT: 0.8 % (ref 0.0–3.0)
Basophils Absolute: 0.1 10*3/uL (ref 0.0–0.1)
EOS PCT: 0.9 % (ref 0.0–5.0)
Eosinophils Absolute: 0.1 10*3/uL (ref 0.0–0.7)
HCT: 39.1 % (ref 36.0–46.0)
Hemoglobin: 12.9 g/dL (ref 12.0–15.0)
LYMPHS ABS: 2.1 10*3/uL (ref 0.7–4.0)
Lymphocytes Relative: 27.9 % (ref 12.0–46.0)
MCHC: 33.1 g/dL (ref 30.0–36.0)
MCV: 84.5 fl (ref 78.0–100.0)
MONO ABS: 0.6 10*3/uL (ref 0.1–1.0)
MONOS PCT: 7.6 % (ref 3.0–12.0)
NEUTROS ABS: 4.8 10*3/uL (ref 1.4–7.7)
NEUTROS PCT: 62.8 % (ref 43.0–77.0)
Platelets: 301 10*3/uL (ref 150.0–400.0)
RBC: 4.62 Mil/uL (ref 3.87–5.11)
RDW: 13.1 % (ref 11.5–15.5)
WBC: 7.6 10*3/uL (ref 4.0–10.5)

## 2015-01-27 LAB — LIPID PANEL
CHOL/HDL RATIO: 5
CHOLESTEROL: 220 mg/dL — AB (ref 0–200)
HDL: 44.5 mg/dL (ref >39.00)
LDL Cholesterol: 138 mg/dL — ABNORMAL HIGH (ref 0–99)
NonHDL: 175.14
TRIGLYCERIDES: 185 mg/dL — AB (ref 0.0–149.0)
VLDL: 37 mg/dL (ref 0.0–40.0)

## 2015-01-27 LAB — TSH: TSH: 3.17 u[IU]/mL (ref 0.35–4.50)

## 2015-01-27 LAB — T4, FREE: FREE T4: 0.71 ng/dL (ref 0.60–1.60)

## 2015-01-31 LAB — HIV ANTIBODY (ROUTINE TESTING W REFLEX): HIV: NONREACTIVE

## 2015-02-02 ENCOUNTER — Telehealth: Payer: Self-pay | Admitting: Family Medicine

## 2015-02-02 NOTE — Telephone Encounter (Signed)
Pt called questioning the last message that was left regarding cancelled appts.  I explained her past appts, and pt made appt for cpe but she still wants further explanation of the message you lfet.  cb number is [phone] Thank you

## 2015-02-03 NOTE — Telephone Encounter (Signed)
Spoke to pt and advised that she is needing a CPE, which has been scheduled.

## 2015-02-15 ENCOUNTER — Encounter: Payer: Self-pay | Admitting: Family Medicine

## 2015-02-15 ENCOUNTER — Ambulatory Visit (INDEPENDENT_AMBULATORY_CARE_PROVIDER_SITE_OTHER): Payer: BC Managed Care – PPO | Admitting: Family Medicine

## 2015-02-15 VITALS — BP 110/78 | HR 76 | Temp 98.0°F | Ht 66.5 in | Wt 193.5 lb

## 2015-02-15 DIAGNOSIS — F418 Other specified anxiety disorders: Secondary | ICD-10-CM

## 2015-02-15 DIAGNOSIS — Z01419 Encounter for gynecological examination (general) (routine) without abnormal findings: Secondary | ICD-10-CM

## 2015-02-15 DIAGNOSIS — F419 Anxiety disorder, unspecified: Secondary | ICD-10-CM

## 2015-02-15 DIAGNOSIS — F329 Major depressive disorder, single episode, unspecified: Secondary | ICD-10-CM | POA: Insufficient documentation

## 2015-02-15 DIAGNOSIS — Z0000 Encounter for general adult medical examination without abnormal findings: Secondary | ICD-10-CM | POA: Diagnosis not present

## 2015-02-15 DIAGNOSIS — E039 Hypothyroidism, unspecified: Secondary | ICD-10-CM | POA: Diagnosis not present

## 2015-02-15 NOTE — Patient Instructions (Signed)
Great to see you. Happy Holidays!  Please try to cut back on fried and fatty foods after the holidays.

## 2015-02-15 NOTE — Assessment & Plan Note (Signed)
Reviewed preventive care protocols, scheduled due services, and updated immunizations Discussed nutrition, exercise, diet, and healthy lifestyle.  

## 2015-02-15 NOTE — Assessment & Plan Note (Signed)
Well controlled on current dose of synthroid. No changes made today. 

## 2015-02-15 NOTE — Progress Notes (Signed)
Subjective:   Patient ID: Misty Rogers, female    DOB: [birth year], 49 y.o.   MRN: [id number]  Misty Rogers is a pleasant 49 y.o. year old female presents to clinic today with Annual Exam  on 02/15/2015  HPI:   No h/o abnormal pap smears in past 5 years.  Last  Pap smear 01/20/14- done by me. Has been having irregular periods. Has already received flu shot. Mammogram 07/14/2014   Wt Readings from Last 3 Encounters:  02/15/15 193 lb 8 oz (87.771 kg)  11/29/14 191 lb 4 oz (86.75 kg)  06/29/14 185 lb 12 oz (84.256 kg)   Anxiety/depression-  Feeling symptoms of  anxiety and depression are well controlled on celexa 20 mg daily.   Hypothyroidism- has been better about remembering to take her  synthroid 25 mcg daily. Denies any symptoms of hypo or hyperthyroidism. Lab Results  Component Value Date   TSH 3.17 01/27/2015    Lab Results  Component Value Date   CHOL 220* 01/27/2015   HDL 44.50 01/27/2015   LDLCALC 138* 01/27/2015   TRIG 185.0* 01/27/2015   CHOLHDL 5 01/27/2015   Lab Results  Component Value Date   WBC 7.6 01/27/2015   HGB 12.9 01/27/2015   HCT 39.1 01/27/2015   MCV 84.5 01/27/2015   PLT 301.0 01/27/2015   Lab Results  Component Value Date   CREATININE 0.86 01/27/2015   Lab Results  Component Value Date   TSH 3.17 01/27/2015    Current Outpatient Prescriptions on File Prior to Visit  Medication Sig Dispense Refill  . citalopram (CELEXA) 20 MG tablet TAKE 1 TABLET BY MOUTH ONCE DAILY. 30 tablet 3  . levothyroxine (SYNTHROID, LEVOTHROID) 25 MCG tablet Take 1 tablet (25 mcg total) by mouth daily before breakfast. 30 tablet 1   No current facility-administered medications on file prior to visit.    No Known Allergies  Past Medical History  Diagnosis Date  . Anxiety     No past surgical history on file.  Family History  Problem Relation Age of Onset  . Hypertension Father   . Seizures Father   . Leukemia Father   . Cancer  Maternal Aunt 38    ovarian    Social History   Social History  . Marital Status: Married    Spouse Name: N/A  . Number of Children: N/A  . Years of Education: N/A   Occupational History  . Not on file.   Social History Main Topics  . Smoking status: Never Smoker   . Smokeless tobacco: Never Used  . Alcohol Use: No  . Drug Use: No  . Sexual Activity: Not on file   Other Topics Concern  . Not on file   Social History Narrative   Teacher   Married, two children.   The PMH, PSH, Social History, Family History, Medications, and allergies have been reviewed in [facility], and have been updated if relevant.  Review of Systems  Constitutional: Negative.   HENT: Negative.   Eyes: Negative.   Respiratory: Negative.   Cardiovascular: Negative.   Gastrointestinal: Negative.   Endocrine: Negative.   Genitourinary: Negative.   Musculoskeletal: Negative.   Allergic/Immunologic: Negative.   Neurological: Negative.   Hematological: Negative.   Psychiatric/Behavioral: Negative.   All other systems reviewed and are negative.       Objective:    BP 110/78 mmHg  Pulse 76  Temp(Src) 98 F (36.7 C) (Oral)  Ht 5' 6.5" (1.689 m)  [facility]  193 lb 8 oz (87.771 kg)  BMI 30.77 kg/m2   Physical Exam    General:  Well-developed,well-nourished,in no acute distress; alert,appropriate and cooperative throughout examination Head:  normocephalic and atraumatic.   Eyes:  vision grossly intact, pupils equal, pupils round, and pupils reactive to light.   Ears:  R ear normal and L ear normal.   Nose:  no external deformity.   Mouth:  good dentition.   Neck:  No deformities, masses, or tenderness noted. Breasts:  No mass, nodules, thickening, tenderness, bulging, retraction, inflamation, nipple discharge or skin changes noted.   Lungs:  Normal respiratory effort, chest expands symmetrically. Lungs are clear to auscultation, no crackles or wheezes. Heart:  Normal rate and regular rhythm. S1 and  S2 normal without gallop, murmur, click, rub or other extra sounds. Abdomen:  Bowel sounds positive,abdomen soft and non-tender without masses, organomegaly or hernias noted. Msk:  No deformity or scoliosis noted of thoracic or lumbar spine.   Extremities:  No clubbing, cyanosis, edema, or deformity noted with normal full range of motion of all joints.   Neurologic:  alert & oriented X3 and gait normal.   Skin:  Intact without suspicious lesions or rashes Cervical Nodes:  No lymphadenopathy noted Axillary Nodes:  No palpable lymphadenopathy Psych:  Cognition and judgment appear intact. Alert and cooperative with normal attention span and concentration. No apparent delusions, illusions, hallucinations      Assessment & Plan:   Well woman exam  Anxiety and depression  Hypothyroidism, unspecified hypothyroidism type No Follow-up on file.

## 2015-02-15 NOTE — Progress Notes (Signed)
Pre visit review using our clinic review tool, if applicable. No additional management support is needed unless otherwise documented below in the visit note. 

## 2015-02-15 NOTE — Assessment & Plan Note (Signed)
Well controlled on current dose of celexa. No changes made today. 

## 2015-04-27 ENCOUNTER — Other Ambulatory Visit: Payer: Self-pay | Admitting: *Deleted

## 2015-04-27 MED ORDER — LEVOTHYROXINE SODIUM 25 MCG PO TABS: 25.0000 ug | ORAL_TABLET | Freq: Every day | ORAL | Status: DC

## 2015-04-27 MED ORDER — CITALOPRAM HYDROBROMIDE 20 MG PO TABS: ORAL_TABLET | ORAL | Status: DC

## 2015-11-02 ENCOUNTER — Other Ambulatory Visit: Payer: Self-pay | Admitting: *Deleted

## 2015-11-02 MED ORDER — CITALOPRAM HYDROBROMIDE 20 MG PO TABS: ORAL_TABLET | ORAL | 0 refills | Status: DC

## 2016-01-26 ENCOUNTER — Other Ambulatory Visit: Payer: Self-pay | Admitting: *Deleted

## 2016-01-26 MED ORDER — CITALOPRAM HYDROBROMIDE 20 MG PO TABS: ORAL_TABLET | ORAL | 0 refills | Status: DC

## 2016-02-16 ENCOUNTER — Ambulatory Visit (INDEPENDENT_AMBULATORY_CARE_PROVIDER_SITE_OTHER): Payer: BC Managed Care – PPO | Admitting: Family Medicine

## 2016-02-16 ENCOUNTER — Encounter: Payer: Self-pay | Admitting: Family Medicine

## 2016-02-16 ENCOUNTER — Encounter: Payer: BC Managed Care – PPO | Admitting: Family Medicine

## 2016-02-16 ENCOUNTER — Other Ambulatory Visit (HOSPITAL_COMMUNITY)
Admission: RE | Admit: 2016-02-16 | Discharge: 2016-02-16 | Disposition: A | Discharge status: Home / Self Care | Payer: BC Managed Care – PPO | Source: Ambulatory Visit | Attending: Family Medicine | Admitting: Family Medicine

## 2016-02-16 VITALS — BP 124/70 | HR 74 | Temp 98.2°F | Ht 66.0 in | Wt 199.8 lb

## 2016-02-16 DIAGNOSIS — Z01419 Encounter for gynecological examination (general) (routine) without abnormal findings: Secondary | ICD-10-CM | POA: Diagnosis not present

## 2016-02-16 DIAGNOSIS — Z1151 Encounter for screening for human papillomavirus (HPV): Secondary | ICD-10-CM | POA: Insufficient documentation

## 2016-02-16 DIAGNOSIS — F418 Other specified anxiety disorders: Secondary | ICD-10-CM | POA: Diagnosis not present

## 2016-02-16 DIAGNOSIS — F419 Anxiety disorder, unspecified: Secondary | ICD-10-CM

## 2016-02-16 DIAGNOSIS — E039 Hypothyroidism, unspecified: Secondary | ICD-10-CM

## 2016-02-16 DIAGNOSIS — Z1211 Encounter for screening for malignant neoplasm of colon: Secondary | ICD-10-CM

## 2016-02-16 DIAGNOSIS — F329 Major depressive disorder, single episode, unspecified: Secondary | ICD-10-CM

## 2016-02-16 LAB — TSH: TSH: 2.87 u[IU]/mL (ref 0.35–4.50)

## 2016-02-16 LAB — CBC WITH DIFFERENTIAL/PLATELET
BASOS ABS: 0 10*3/uL (ref 0.0–0.1)
BASOS PCT: 0.5 % (ref 0.0–3.0)
EOS ABS: 0.1 10*3/uL (ref 0.0–0.7)
Eosinophils Relative: 1.9 % (ref 0.0–5.0)
HEMATOCRIT: 39.5 % (ref 36.0–46.0)
HEMOGLOBIN: 13.3 g/dL (ref 12.0–15.0)
LYMPHS PCT: 29.5 % (ref 12.0–46.0)
Lymphs Abs: 1.8 10*3/uL (ref 0.7–4.0)
MCHC: 33.6 g/dL (ref 30.0–36.0)
MCV: 83.6 fl (ref 78.0–100.0)
MONOS PCT: 7.4 % (ref 3.0–12.0)
Monocytes Absolute: 0.5 10*3/uL (ref 0.1–1.0)
Neutro Abs: 3.7 10*3/uL (ref 1.4–7.7)
Neutrophils Relative %: 60.7 % (ref 43.0–77.0)
Platelets: 309 10*3/uL (ref 150.0–400.0)
RBC: 4.73 Mil/uL (ref 3.87–5.11)
RDW: 13.9 % (ref 11.5–15.5)
WBC: 6.2 10*3/uL (ref 4.0–10.5)

## 2016-02-16 LAB — COMPREHENSIVE METABOLIC PANEL
ALBUMIN: 4.3 g/dL (ref 3.5–5.2)
ALK PHOS: 87 U/L (ref 39–117)
ALT: 22 U/L (ref 0–35)
AST: 20 U/L (ref 0–37)
BILIRUBIN TOTAL: 0.4 mg/dL (ref 0.2–1.2)
BUN: 14 mg/dL (ref 6–23)
CALCIUM: 9.9 mg/dL (ref 8.4–10.5)
CO2: 30 mEq/L (ref 19–32)
CREATININE: 0.77 mg/dL (ref 0.40–1.20)
Chloride: 103 mEq/L (ref 96–112)
GFR: 84.09 mL/min (ref >60.00)
Glucose, Bld: 85 mg/dL (ref 70–99)
Potassium: 3.9 mEq/L (ref 3.5–5.1)
Sodium: 140 mEq/L (ref 135–145)
TOTAL PROTEIN: 7.3 g/dL (ref 6.0–8.3)

## 2016-02-16 LAB — LIPID PANEL
CHOL/HDL RATIO: 5
CHOLESTEROL: 224 mg/dL — AB (ref 0–200)
HDL: 44.5 mg/dL (ref >39.00)
LDL Cholesterol: 145 mg/dL — ABNORMAL HIGH (ref 0–99)
NonHDL: 179.88
TRIGLYCERIDES: 176 mg/dL — AB (ref 0.0–149.0)
VLDL: 35.2 mg/dL (ref 0.0–40.0)

## 2016-02-16 LAB — T4, FREE: FREE T4: 0.75 ng/dL (ref 0.60–1.60)

## 2016-02-16 MED ORDER — CITALOPRAM HYDROBROMIDE 20 MG PO TABS: ORAL_TABLET | ORAL | 2 refills | Status: DC

## 2016-02-16 MED ORDER — LEVOTHYROXINE SODIUM 25 MCG PO TABS: 25.0000 ug | ORAL_TABLET | Freq: Every day | ORAL | 2 refills | Status: DC

## 2016-02-16 NOTE — Progress Notes (Signed)
Pre visit review using our clinic review tool, if applicable. No additional management support is needed unless otherwise documented below in the visit note. 

## 2016-02-16 NOTE — Patient Instructions (Signed)
Great to see you. Happy Holidays.  We will call you with your results from today and your GI appointment.

## 2016-02-16 NOTE — Progress Notes (Signed)
Subjective:   Patient ID: Misty Rogers, female    DOB: 1965-12-16, 50 y.o.   MRN: [id number]  Misty Rogers is a pleasant 50 y.o. year old female presents to clinic today with Annual Exam  on 02/16/2016  HPI:   No h/o abnormal pap smears in past 5 years.  Last  Pap smear 01/20/14- done by me.  Mammogram 07/14/2014  Influenza vaccine 12/15/15   Wt Readings from Last 3 Encounters:  02/16/16 199 lb 12 oz (90.6 kg)  02/15/15 193 lb 8 oz (87.8 kg)  11/29/14 191 lb 4 oz (86.8 kg)   Anxiety/depression-  Feeling symptoms of  anxiety and depression are well controlled on celexa 20 mg daily.   Hypothyroidism- taking synthroid 25 mcg daily. Denies any symptoms of hypo or hyperthyroidism. Due for labs. Lab Results  Component Value Date   TSH 3.17 01/27/2015    Lab Results  Component Value Date   CHOL 220 (H) 01/27/2015   HDL 44.50 01/27/2015   LDLCALC 138 (H) 01/27/2015   TRIG 185.0 (H) 01/27/2015   CHOLHDL 5 01/27/2015   Lab Results  Component Value Date   WBC 7.6 01/27/2015   HGB 12.9 01/27/2015   HCT 39.1 01/27/2015   MCV 84.5 01/27/2015   PLT 301.0 01/27/2015   Lab Results  Component Value Date   CREATININE 0.86 01/27/2015   Lab Results  Component Value Date   TSH 3.17 01/27/2015    No current outpatient prescriptions on file prior to visit.   No current facility-administered medications on file prior to visit.     No Known Allergies  Past Medical History:  Diagnosis Date  . Anxiety     No past surgical history on file.  Family History  Problem Relation Age of Onset  . Hypertension Father   . Seizures Father   . Leukemia Father   . Cancer Maternal Aunt 54    ovarian    Social History   Social History  . Marital status: Married    Spouse name: N/A  . Number of children: N/A  . Years of education: N/A   Occupational History  . Not on file.   Social History Main Topics  . Smoking status: Never Smoker  . Smokeless tobacco:  Never Used  . Alcohol use No  . Drug use: No  . Sexual activity: Not on file   Other Topics Concern  . Not on file   Social History Narrative   Teacher   Married, two children.   The PMH, PSH, Social History, Family History, Medications, and allergies have been reviewed in [facility], and have been updated if relevant.  Review of Systems  Constitutional: Negative.   HENT: Negative.   Eyes: Negative.   Respiratory: Negative.   Cardiovascular: Negative.   Gastrointestinal: Negative.   Endocrine: Negative.   Genitourinary: Negative.   Musculoskeletal: Negative.   Allergic/Immunologic: Negative.   Neurological: Negative.   Hematological: Negative.   Psychiatric/Behavioral: Negative.   All other systems reviewed and are negative.       Objective:    BP 124/70   Pulse 74   Temp 98.2 F (36.8 C) (Oral)   Ht 5\' 6"  (1.676 m)   Wt 199 lb 12 oz (90.6 kg)   SpO2 97%   BMI 32.24 kg/m    Physical Exam  General:  Well-developed,well-nourished,in no acute distress; alert,appropriate and cooperative throughout examination Head:  normocephalic and atraumatic.   Eyes:  vision grossly intact, PERRL Ears:  R ear normal and L ear normal externally, TMs clear bilaterally Nose:  no external deformity.   Mouth:  good dentition.   Neck:  No deformities, masses, or tenderness noted. Breasts:  No mass, nodules, thickening, tenderness, bulging, retraction, inflamation, nipple discharge or skin changes noted.   Lungs:  Normal respiratory effort, chest expands symmetrically. Lungs are clear to auscultation, no crackles or wheezes. Heart:  Normal rate and regular rhythm. S1 and S2 normal without gallop, murmur, click, rub or other extra sounds. Abdomen:  Bowel sounds positive,abdomen soft and non-tender without masses, organomegaly or hernias noted. Rectal:  no external abnormalities.   Genitalia:  Pelvic Exam:        External: normal female genitalia without lesions or masses        Vagina:  normal without lesions or masses        Cervix: normal without lesions or masses        Adnexa: normal bimanual exam without masses or fullness        Uterus: normal by palpation        Pap smear: performed Msk:  No deformity or scoliosis noted of thoracic or lumbar spine.   Extremities:  No clubbing, cyanosis, edema, or deformity noted with normal full range of motion of all joints.   Neurologic:  alert & oriented X3 and gait normal.   Skin:  Intact without suspicious lesions or rashes Cervical Nodes:  No lymphadenopathy noted Axillary Nodes:  No palpable lymphadenopathy Psych:  Cognition and judgment appear intact. Alert and cooperative with normal attention span and concentration. No apparent delusions, illusions, hallucinations      Assessment & Plan:   Well woman exam  Hypothyroidism, unspecified type  Anxiety and depression No Follow-up on file.

## 2016-02-16 NOTE — Assessment & Plan Note (Signed)
Well controlled on current dose of celexa.

## 2016-02-16 NOTE — Addendum Note (Signed)
Addended by: Modena Nunnery on: 02/16/2016 09:46 AM   Modules accepted: Orders

## 2016-02-16 NOTE — Assessment & Plan Note (Signed)
Continue current dose of synthroid.  Check labs today. 

## 2016-02-16 NOTE — Assessment & Plan Note (Signed)
Reviewed preventive care protocols, scheduled due services, and updated immunizations Discussed nutrition, exercise, diet, and healthy lifestyle.  Orders Placed This Encounter  Procedures  . CBC with Differential/Platelet  . Comprehensive metabolic panel  . Lipid panel  . TSH  . T4, Free  . Ambulatory referral to Gastroenterology   Referral placed for colonoscopy. Pt to call to schedule mammogram.

## 2016-02-17 ENCOUNTER — Encounter: Payer: Self-pay | Admitting: *Deleted

## 2016-02-17 LAB — CYTOLOGY - PAP
DIAGNOSIS: NEGATIVE
HPV (WINDOPATH): NOT DETECTED

## 2016-04-06 ENCOUNTER — Other Ambulatory Visit: Payer: Self-pay

## 2016-04-17 ENCOUNTER — Ambulatory Visit (INDEPENDENT_AMBULATORY_CARE_PROVIDER_SITE_OTHER): Payer: BC Managed Care – PPO | Admitting: Family Medicine

## 2016-04-17 ENCOUNTER — Encounter: Payer: Self-pay | Admitting: Family Medicine

## 2016-04-17 VITALS — BP 122/80 | HR 81 | Temp 98.9°F | Wt 198.2 lb

## 2016-04-17 DIAGNOSIS — J069 Acute upper respiratory infection, unspecified: Secondary | ICD-10-CM | POA: Diagnosis not present

## 2016-04-17 MED ORDER — PROMETHAZINE-DM 6.25-15 MG/5ML PO SYRP: 5.0000 mL | ORAL_SOLUTION | Freq: Four times a day (QID) | ORAL | 0 refills | Status: DC | PRN

## 2016-04-17 NOTE — Progress Notes (Signed)
SUBJECTIVE:  Misty Rogers is a 51 y.o. female who complains of coryza, congestion and sneezing for 4 days. She denies a history of anorexia and chest pain and denies a history of asthma. Patient denies smoke cigarettes.   Current Outpatient Prescriptions on File Prior to Visit  Medication Sig Dispense Refill  . citalopram (CELEXA) 20 MG tablet TAKE 1 TABLET BY MOUTH ONCE DAILY. 90 tablet 2  . levothyroxine (SYNTHROID, LEVOTHROID) 25 MCG tablet Take 1 tablet (25 mcg total) by mouth daily before breakfast. 90 tablet 2   No current facility-administered medications on file prior to visit.     No Known Allergies  Past Medical History:  Diagnosis Date  . Anxiety     No past surgical history on file.  Family History  Problem Relation Age of Onset  . Hypertension Father   . Seizures Father   . Leukemia Father   . Cancer Maternal Aunt 8    ovarian    Social History   Social History  . Marital status: Married    Spouse name: N/A  . Number of children: N/A  . Years of education: N/A   Occupational History  . Not on file.   Social History Main Topics  . Smoking status: Never Smoker  . Smokeless tobacco: Never Used  . Alcohol use No  . Drug use: No  . Sexual activity: Not on file   Other Topics Concern  . Not on file   Social History Narrative   Teacher   Married, two children.   The PMH, PSH, Social History, Family History, Medications, and allergies have been reviewed in [facility], and have been updated if relevant.  OBJECTIVE: BP 122/80   Pulse 81   Temp 98.9 F (37.2 C) (Oral)   Wt 198 lb 4 oz (89.9 kg)   SpO2 96%   BMI 32.00 kg/m   She appears well, vital signs are as noted. Ears normal.  Throat and pharynx normal.  Neck supple. No adenopathy in the neck. Nose is congested. Sinuses non tender. The chest is clear, without wheezes or rales.  ASSESSMENT:  viral upper respiratory illness  PLAN: Symptomatic therapy suggested: push fluids, rest and return  office visit prn if symptoms persist or worsen. Lack of antibiotic effectiveness discussed with her. Call or return to clinic prn if these symptoms worsen or fail to improve as anticipated.

## 2016-04-17 NOTE — Progress Notes (Signed)
Pre visit review using our clinic review tool, if applicable. No additional management support is needed unless otherwise documented below in the visit note. 

## 2016-04-18 DIAGNOSIS — Z86018 Personal history of other benign neoplasm: Secondary | ICD-10-CM

## 2016-04-18 HISTORY — DX: Personal history of other benign neoplasm: Z86.018

## 2016-08-13 ENCOUNTER — Telehealth: Payer: Self-pay | Admitting: Gastroenterology

## 2016-08-13 NOTE — Telephone Encounter (Signed)
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 She will r/s when she knows her schedule better.

## 2016-08-14 NOTE — Telephone Encounter (Signed)
Left pt a message to let her know her colonoscopy has been canceled as she has requested.  Notified Kim at [facility] to cancel colonoscopy.

## 2016-08-17 ENCOUNTER — Encounter: Admission: RE | Payer: Self-pay | Source: Ambulatory Visit

## 2016-08-17 ENCOUNTER — Ambulatory Visit
Admission: RE | Admit: 2016-08-17 | Payer: BC Managed Care – PPO | Source: Ambulatory Visit | Admitting: Gastroenterology

## 2016-08-17 SURGERY — COLONOSCOPY WITH PROPOFOL
Anesthesia: Choice

## 2016-08-30 ENCOUNTER — Other Ambulatory Visit: Payer: Self-pay

## 2016-08-30 MED ORDER — CITALOPRAM HYDROBROMIDE 20 MG PO TABS: ORAL_TABLET | ORAL | 2 refills | Status: DC

## 2016-08-30 NOTE — Telephone Encounter (Signed)
Last refill 02/16/16 Last OV 02/16/16 Ok to refill?

## 2016-11-07 ENCOUNTER — Other Ambulatory Visit: Payer: Self-pay

## 2016-11-07 MED ORDER — LEVOTHYROXINE SODIUM 25 MCG PO TABS: 25.0000 ug | ORAL_TABLET | Freq: Every day | ORAL | 0 refills | Status: DC

## 2016-12-25 IMAGING — MG MM DIGITAL SCREENING BILAT W/ CAD
5 series · 5 of 5 positions shown · non-contrast
Comparison: Previous exam(s).

CLINICAL DATA: Screening.

EXAM:
DIGITAL SCREENING BILATERAL MAMMOGRAM WITH CAD

[R MLO (1 of 2)]
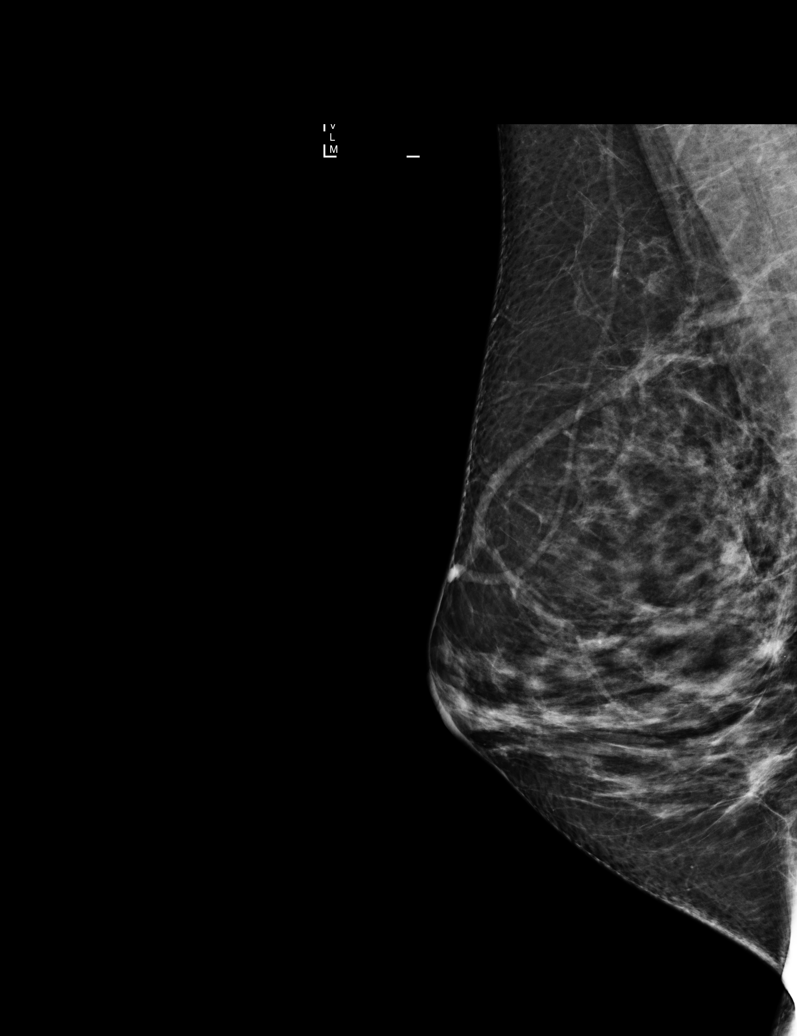

[L CC]
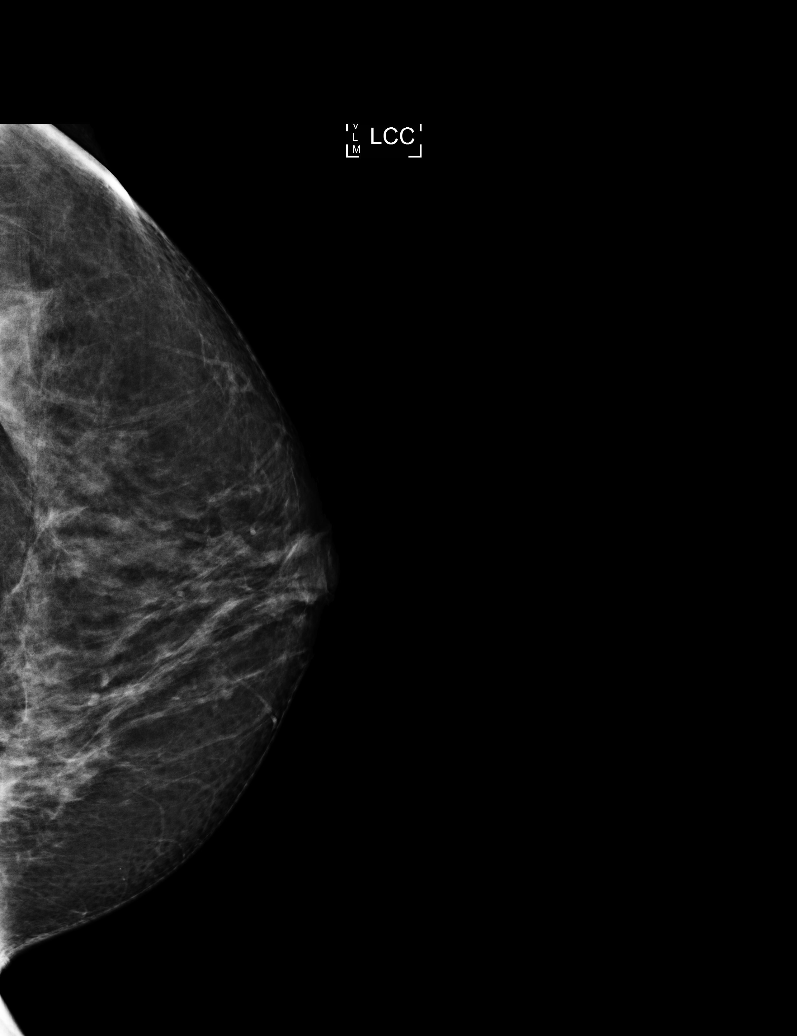

[R MLO (2 of 2)]
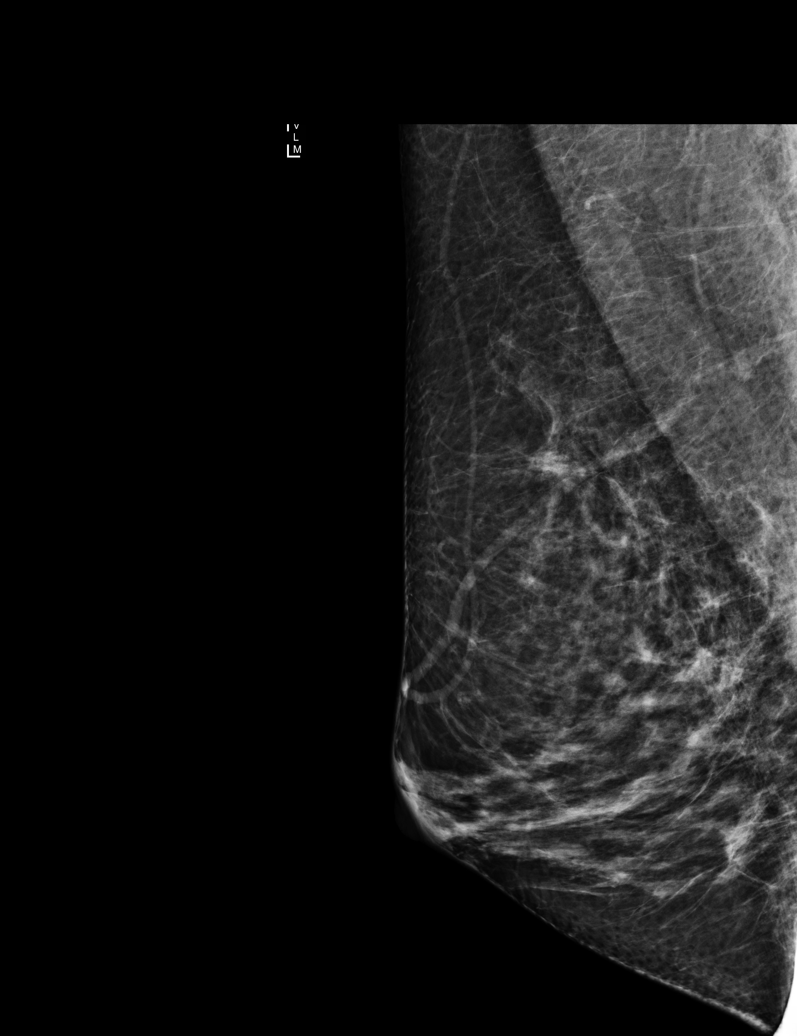

[L MLO]
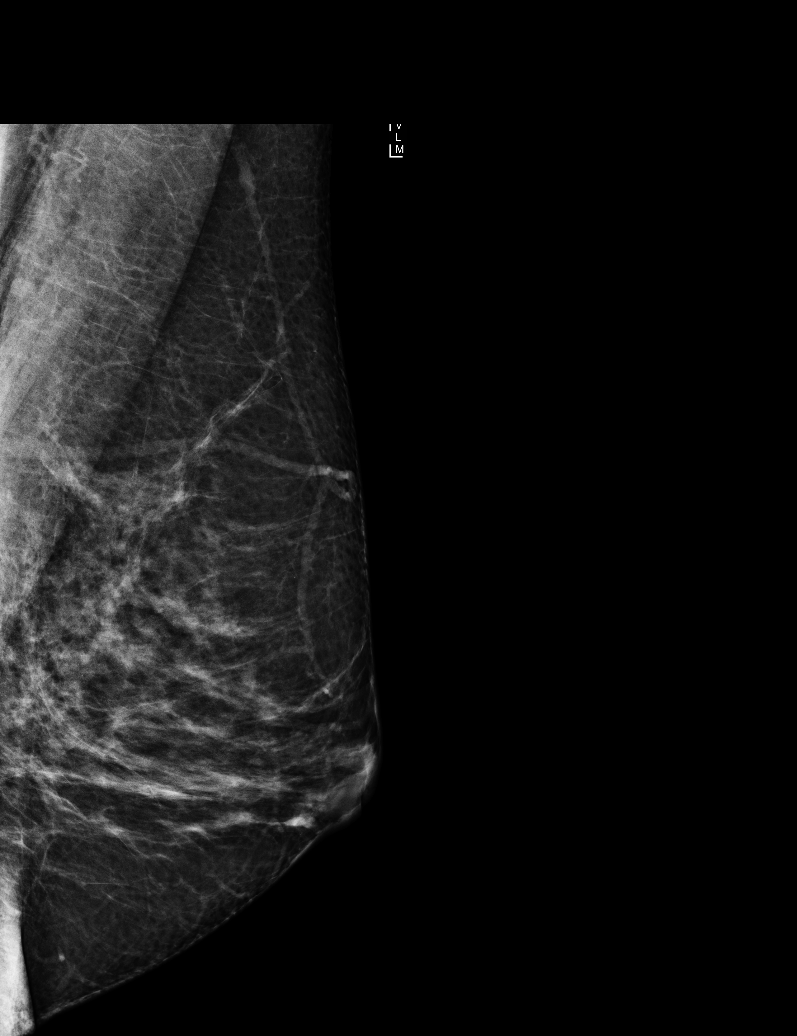

[R CC]
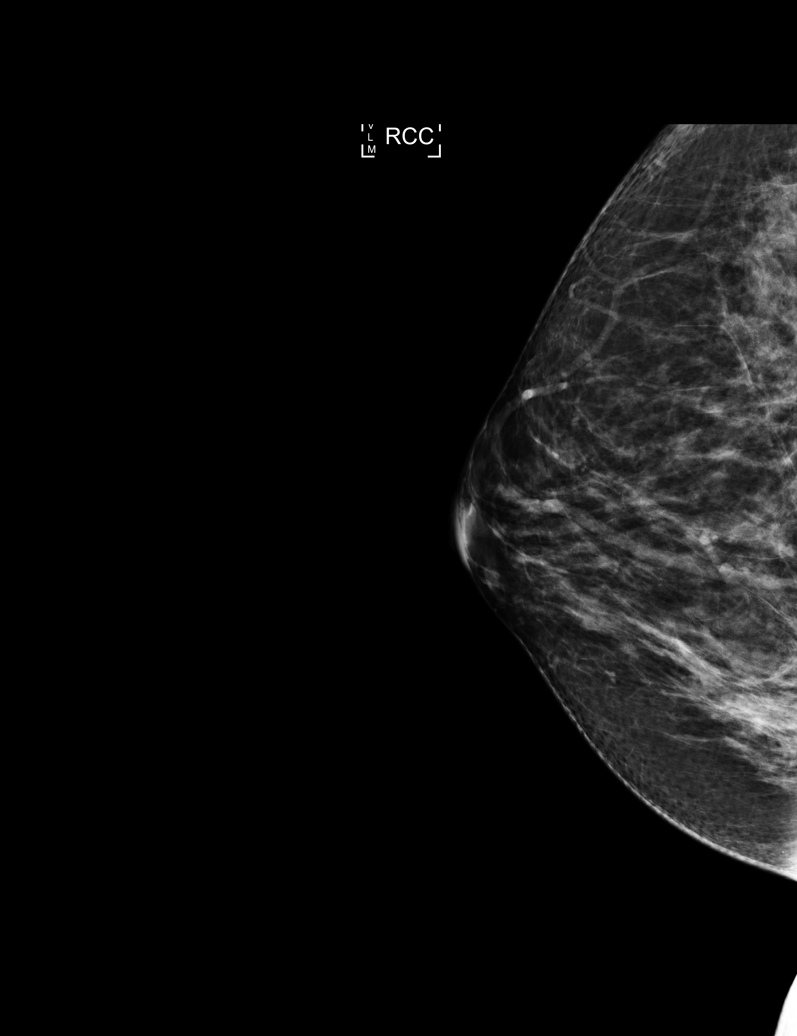

[5 of 5 positions shown; findings below may reference images not displayed]

ACR Breast Density Category b: There are scattered areas of
fibroglandular density.
FINDINGS: There are no findings suspicious for malignancy. Images were
processed with CAD.
IMPRESSION: No mammographic evidence of malignancy. A result letter of this
screening mammogram will be mailed directly to the patient.

RECOMMENDATION:
Screening mammogram in one year. (Code:AS-G-LCT)

BI-RADS CATEGORY  1: Negative.

## 2017-06-04 ENCOUNTER — Other Ambulatory Visit: Payer: Self-pay | Admitting: Family Medicine

## 2017-06-05 NOTE — Telephone Encounter (Signed)
Has been over a year since seen by Dr. Aron/needs to sched OV first/thx dmf

## 2017-08-13 ENCOUNTER — Other Ambulatory Visit (HOSPITAL_COMMUNITY)
Admission: RE | Admit: 2017-08-13 | Discharge: 2017-08-13 | Disposition: A | Discharge status: Home / Self Care | Payer: BC Managed Care – PPO | Source: Ambulatory Visit | Attending: Family Medicine | Admitting: Family Medicine

## 2017-08-13 ENCOUNTER — Ambulatory Visit (INDEPENDENT_AMBULATORY_CARE_PROVIDER_SITE_OTHER): Payer: BC Managed Care – PPO | Admitting: Family Medicine

## 2017-08-13 ENCOUNTER — Other Ambulatory Visit: Payer: Self-pay

## 2017-08-13 ENCOUNTER — Encounter: Payer: Self-pay | Admitting: Family Medicine

## 2017-08-13 VITALS — BP 116/88 | HR 83 | Temp 98.4°F | Ht 66.25 in | Wt 196.4 lb

## 2017-08-13 DIAGNOSIS — Z1231 Encounter for screening mammogram for malignant neoplasm of breast: Secondary | ICD-10-CM | POA: Diagnosis not present

## 2017-08-13 DIAGNOSIS — Z1239 Encounter for other screening for malignant neoplasm of breast: Secondary | ICD-10-CM

## 2017-08-13 DIAGNOSIS — E039 Hypothyroidism, unspecified: Secondary | ICD-10-CM

## 2017-08-13 DIAGNOSIS — Z01419 Encounter for gynecological examination (general) (routine) without abnormal findings: Secondary | ICD-10-CM | POA: Insufficient documentation

## 2017-08-13 DIAGNOSIS — N898 Other specified noninflammatory disorders of vagina: Secondary | ICD-10-CM | POA: Insufficient documentation

## 2017-08-13 DIAGNOSIS — F329 Major depressive disorder, single episode, unspecified: Secondary | ICD-10-CM

## 2017-08-13 DIAGNOSIS — Z23 Encounter for immunization: Secondary | ICD-10-CM | POA: Diagnosis not present

## 2017-08-13 DIAGNOSIS — F419 Anxiety disorder, unspecified: Secondary | ICD-10-CM | POA: Diagnosis not present

## 2017-08-13 LAB — LIPID PANEL
CHOLESTEROL: 242 mg/dL — AB (ref 0–200)
HDL: 45.7 mg/dL (ref >39.00)
LDL CALC: 157 mg/dL — AB (ref 0–99)
NonHDL: 196.36
TRIGLYCERIDES: 196 mg/dL — AB (ref 0.0–149.0)
Total CHOL/HDL Ratio: 5
VLDL: 39.2 mg/dL (ref 0.0–40.0)

## 2017-08-13 LAB — COMPREHENSIVE METABOLIC PANEL
ALT: 20 U/L (ref 0–35)
AST: 16 U/L (ref 0–37)
Albumin: 4.3 g/dL (ref 3.5–5.2)
Alkaline Phosphatase: 87 U/L (ref 39–117)
BUN: 14 mg/dL (ref 6–23)
CALCIUM: 9.8 mg/dL (ref 8.4–10.5)
CHLORIDE: 104 meq/L (ref 96–112)
CO2: 29 mEq/L (ref 19–32)
Creatinine, Ser: 0.82 mg/dL (ref 0.40–1.20)
GFR: 77.74 mL/min (ref >60.00)
Glucose, Bld: 87 mg/dL (ref 70–99)
POTASSIUM: 4.4 meq/L (ref 3.5–5.1)
SODIUM: 140 meq/L (ref 135–145)
Total Bilirubin: 0.3 mg/dL (ref 0.2–1.2)
Total Protein: 6.9 g/dL (ref 6.0–8.3)

## 2017-08-13 LAB — TSH: TSH: 6.29 u[IU]/mL — ABNORMAL HIGH (ref 0.35–4.50)

## 2017-08-13 LAB — CBC WITH DIFFERENTIAL/PLATELET
BASOS PCT: 0.8 % (ref 0.0–3.0)
Basophils Absolute: 0 10*3/uL (ref 0.0–0.1)
EOS PCT: 1 % (ref 0.0–5.0)
Eosinophils Absolute: 0 10*3/uL (ref 0.0–0.7)
HCT: 41.1 % (ref 36.0–46.0)
Hemoglobin: 13.9 g/dL (ref 12.0–15.0)
Lymphocytes Relative: 31 % (ref 12.0–46.0)
Lymphs Abs: 1.4 10*3/uL (ref 0.7–4.0)
MCHC: 33.7 g/dL (ref 30.0–36.0)
MCV: 84.9 fl (ref 78.0–100.0)
MONO ABS: 0.3 10*3/uL (ref 0.1–1.0)
Monocytes Relative: 7.4 % (ref 3.0–12.0)
NEUTROS ABS: 2.7 10*3/uL (ref 1.4–7.7)
Neutrophils Relative %: 59.8 % (ref 43.0–77.0)
PLATELETS: 292 10*3/uL (ref 150.0–400.0)
RBC: 4.84 Mil/uL (ref 3.87–5.11)
RDW: 14.1 % (ref 11.5–15.5)
WBC: 4.5 10*3/uL (ref 4.0–10.5)

## 2017-08-13 LAB — T4, FREE: Free T4: 0.73 ng/dL (ref 0.60–1.60)

## 2017-08-13 MED ORDER — CITALOPRAM HYDROBROMIDE 20 MG PO TABS: ORAL_TABLET | ORAL | 2 refills | Status: DC

## 2017-08-13 MED ORDER — LEVOTHYROXINE SODIUM 25 MCG PO TABS: 25.0000 ug | ORAL_TABLET | Freq: Every day | ORAL | 1 refills | Status: DC

## 2017-08-13 NOTE — Assessment & Plan Note (Signed)
Stable on current dose of celexa. No changes made.

## 2017-08-13 NOTE — Patient Instructions (Signed)
Great to see you. I will call you with your lab results from today and you can view them online.   Please call [facility] to schedule your mammogram. 

## 2017-08-13 NOTE — Assessment & Plan Note (Signed)
Reviewed preventive care protocols, scheduled due services, and updated immunizations Discussed nutrition, exercise, diet, and healthy lifestyle.  Orders Placed This Encounter  Procedures  . MM Digital Screening  . Tdap vaccine greater than or equal to 52yo IM  . CBC with Differential/Platelet  . Comprehensive metabolic panel  . Lipid panel  . TSH  . T4, free

## 2017-08-13 NOTE — Assessment & Plan Note (Signed)
Thyroid labs today but will likely be off a bit given she has been out of her synthroid.

## 2017-08-13 NOTE — Progress Notes (Signed)
Subjective:   Patient ID: Misty Rogers, female    DOB: 07-29-1965, 52 y.o.   MRN: [id number]  Misty Rogers is a pleasant 52 y.o. year old female presents to clinic today with Annual Exam (Patient is here today for a CPE with PAP.  She is due for a Mammogram which was last done at [facility] and she will call them if a referral is created.  She agrees to [employer].  She agrees to get her Tdap today.  She is currently fasting.  She is experiencing vaginal discharge. She was unable to get a refill of Levothyroxine so she has been without x92months.)  on 08/13/2017  HPI:   No h/o abnormal pap smears in past 5 years.  Last  Pap smear 02/16/16- done by me.  Mammogram 07/14/2014  Health Maintenance  Topic Date Due  . Misty Rogers  05/23/1984  . COLONOSCOPY  05/24/2015  . MAMMOGRAM  07/12/2016  . INFLUENZA VACCINE  09/26/2017  . PAP SMEAR  02/16/2019  . HIV Screening  Completed      Wt Readings from Last 3 Encounters:  08/13/17 196 lb 6.4 oz (89.1 kg)  04/17/16 198 lb 4 oz (89.9 kg)  02/16/16 199 lb 12 oz (90.6 kg)   Anxiety/depression-  Feeling symptoms of  anxiety and depression are well controlled on celexa 20 mg daily.   Hypothyroidism- taking synthroid 25 mcg daily. Denies any symptoms of hypo or hyperthyroidism. Due for labs. Lab Results  Component Value Date   TSH 2.87 02/16/2016    Lab Results  Component Value Date   CHOL 224 (H) 02/16/2016   HDL 44.50 02/16/2016   LDLCALC 145 (H) 02/16/2016   TRIG 176.0 (H) 02/16/2016   CHOLHDL 5 02/16/2016   Lab Results  Component Value Date   WBC 6.2 02/16/2016   HGB 13.3 02/16/2016   HCT 39.5 02/16/2016   MCV 83.6 02/16/2016   PLT 309.0 02/16/2016   Lab Results  Component Value Date   CREATININE 0.77 02/16/2016   Lab Results  Component Value Date   TSH 2.87 02/16/2016    Current Outpatient Medications on File Prior to Visit  Medication Sig Dispense Refill  . citalopram (CELEXA) 20 MG tablet TAKE 1  TABLET BY MOUTH ONCE DAILY. 90 tablet 2  . levothyroxine (SYNTHROID, LEVOTHROID) 25 MCG tablet Take 1 tablet (25 mcg total) by mouth daily before breakfast. (Patient not taking: Reported on 08/13/2017) 90 tablet 0   No current facility-administered medications on file prior to visit.     No Known Allergies  Past Medical History:  Diagnosis Date  . Anxiety     No past surgical history on file.  Family History  Problem Relation Age of Onset  . Hypertension Father   . Seizures Father   . Leukemia Father   . Cancer Maternal Aunt 19       ovarian    Social History   Socioeconomic History  . Marital status: Married    Spouse name: Not on file  . Number of children: Not on file  . Years of education: Not on file  . Highest education level: Not on file  Occupational History  . Not on file  Social Needs  . Financial resource strain: Not on file  . Food insecurity:    Worry: Not on file    Inability: Not on file  . Transportation needs:    Medical: Not on file    Non-medical: Not on file  Tobacco Use  .  Smoking status: Never Smoker  . Smokeless tobacco: Never Used  Substance and Sexual Activity  . Alcohol use: No    Alcohol/week: 0.0 oz  . Drug use: No  . Sexual activity: Not on file  Lifestyle  . Physical activity:    Days per week: Not on file    Minutes per session: Not on file  . Stress: Not on file  Relationships  . Social connections:    Talks on phone: Not on file    Gets together: Not on file    Attends religious service: Not on file    Active member of club or organization: Not on file    Attends meetings of clubs or organizations: Not on file    Relationship status: Not on file  . Intimate partner violence:    Fear of current or ex partner: Not on file    Emotionally abused: Not on file    Physically abused: Not on file    Forced sexual activity: Not on file  Other Topics Concern  . Not on file  Social History Narrative   Teacher   Married, two  children.   The PMH, PSH, Social History, Family History, Medications, and allergies have been reviewed in [facility], and have been updated if relevant.  Review of Systems  Constitutional: Negative.   HENT: Negative.   Eyes: Negative.   Respiratory: Negative.   Cardiovascular: Negative.   Gastrointestinal: Negative.   Endocrine: Negative.   Genitourinary: Positive for vaginal discharge. Negative for decreased urine volume, difficulty urinating, dyspareunia, dysuria, enuresis, flank pain, frequency, genital sores, hematuria, menstrual problem, pelvic pain, urgency, vaginal bleeding and vaginal pain.  Musculoskeletal: Negative.   Allergic/Immunologic: Negative.   Neurological: Negative.   Hematological: Negative.   Psychiatric/Behavioral: Negative.   All other systems reviewed and are negative.       Objective:    BP 116/88 (BP Location: Left Arm, Patient Position: Sitting, Cuff Size: Normal)   Pulse 83   Temp 98.4 F (36.9 C) (Oral)   Ht 5' 6.25" (1.683 m)   Wt 196 lb 6.4 oz (89.1 kg)   SpO2 98%   BMI 31.46 kg/m    Physical Exam   General:  Well-developed,well-nourished,in no acute distress; alert,appropriate and cooperative throughout examination Head:  normocephalic and atraumatic.   Eyes:  vision grossly intact, PERRL Ears:  R ear normal and L ear normal externally, TMs clear bilaterally Nose:  no external deformity.   Mouth:  good dentition.   Neck:  No deformities, masses, or tenderness noted. Breasts:  No mass, nodules, thickening, tenderness, bulging, retraction, inflamation, nipple discharge or skin changes noted.   Lungs:  Normal respiratory effort, chest expands symmetrically. Lungs are clear to auscultation, no crackles or wheezes. Heart:  Normal rate and regular rhythm. S1 and S2 normal without gallop, murmur, click, rub or other extra sounds. Abdomen:  Bowel sounds positive,abdomen soft and non-tender without masses, organomegaly or hernias noted. Rectal:  no  external abnormalities.   Genitalia:  Pelvic Exam:        External: normal female genitalia without lesions or masses        Vagina: normal without lesions or masses        Cervix: normal without lesions or masses        Adnexa: normal bimanual exam without masses or fullness        Uterus: normal by palpation        Pap smear: performed Msk:  No deformity or scoliosis noted  of thoracic or lumbar spine.   Extremities:  No clubbing, cyanosis, edema, or deformity noted with normal full range of motion of all joints.   Neurologic:  alert & oriented X3 and gait normal.   Skin:  Intact without suspicious lesions or rashes Cervical Nodes:  No lymphadenopathy noted Axillary Nodes:  No palpable lymphadenopathy Psych:  Cognition and judgment appear intact. Alert and cooperative with normal attention span and concentration. No apparent delusions, illusions, hallucinations      Assessment & Plan:   Well woman exam with routine gynecological exam - Plan: Cytology - PAP  Need for Tdap vaccination - Plan: Tdap vaccine greater than or equal to 7yo IM  Vaginal discharge - Plan: Cytology - PAP  Hypothyroidism, unspecified type - Plan: CBC with Differential/Platelet, Comprehensive metabolic panel, Lipid panel, TSH, T4, free  Anxiety and depression  Screening for breast cancer - Plan: MM Digital Screening No follow-ups on file.

## 2017-08-14 LAB — CYTOLOGY - PAP
BACTERIAL VAGINITIS: NEGATIVE
CANDIDA VAGINITIS: NEGATIVE
DIAGNOSIS: NEGATIVE
HPV (WINDOPATH): NOT DETECTED

## 2017-08-16 NOTE — Addendum Note (Signed)
Addended by: Doy Hutching on: 08/16/2017 09:35 AM   Modules accepted: Orders

## 2017-08-28 ENCOUNTER — Ambulatory Visit
Admission: RE | Admit: 2017-08-28 | Discharge: 2017-08-28 | Disposition: A | Discharge status: Home / Self Care | Payer: BC Managed Care – PPO | Source: Ambulatory Visit | Attending: Family Medicine | Admitting: Family Medicine

## 2017-08-28 DIAGNOSIS — Z1231 Encounter for screening mammogram for malignant neoplasm of breast: Secondary | ICD-10-CM | POA: Diagnosis not present

## 2017-08-28 DIAGNOSIS — Z1239 Encounter for other screening for malignant neoplasm of breast: Secondary | ICD-10-CM

## 2017-10-07 ENCOUNTER — Telehealth: Payer: Self-pay

## 2017-10-07 NOTE — Telephone Encounter (Signed)
PEC-I LMOVM asking pt if they are in the process of completing the Cologuard through [employer] as we discussed at wellness or if we need to refer her to GI for a Colonoscopy instead/thx dmf

## 2018-03-06 ENCOUNTER — Other Ambulatory Visit: Payer: Self-pay | Admitting: Family Medicine

## 2018-03-07 NOTE — Telephone Encounter (Signed)
Needs O

## 2018-06-16 ENCOUNTER — Other Ambulatory Visit: Payer: Self-pay | Admitting: Family Medicine

## 2018-07-03 ENCOUNTER — Other Ambulatory Visit: Payer: Self-pay | Admitting: Family Medicine

## 2018-07-04 NOTE — Telephone Encounter (Signed)
Lab orders in . Pt needs to complete them to see if any thyroid/med changes

## 2018-09-13 ENCOUNTER — Other Ambulatory Visit: Payer: Self-pay | Admitting: Family Medicine

## 2018-09-22 ENCOUNTER — Other Ambulatory Visit: Payer: Self-pay | Admitting: Family Medicine

## 2018-10-21 ENCOUNTER — Telehealth (INDEPENDENT_AMBULATORY_CARE_PROVIDER_SITE_OTHER): Payer: BC Managed Care – PPO | Admitting: Family Medicine

## 2018-10-21 ENCOUNTER — Encounter: Payer: BC Managed Care – PPO | Admitting: Family Medicine

## 2018-10-21 ENCOUNTER — Other Ambulatory Visit: Payer: Self-pay

## 2018-10-21 DIAGNOSIS — E785 Hyperlipidemia, unspecified: Secondary | ICD-10-CM | POA: Diagnosis not present

## 2018-10-21 DIAGNOSIS — F419 Anxiety disorder, unspecified: Secondary | ICD-10-CM | POA: Diagnosis not present

## 2018-10-21 DIAGNOSIS — E039 Hypothyroidism, unspecified: Secondary | ICD-10-CM | POA: Diagnosis not present

## 2018-10-21 DIAGNOSIS — F329 Major depressive disorder, single episode, unspecified: Secondary | ICD-10-CM

## 2018-10-21 DIAGNOSIS — F32A Depression, unspecified: Secondary | ICD-10-CM

## 2018-10-21 MED ORDER — LEVOTHYROXINE SODIUM 25 MCG PO TABS: ORAL_TABLET | ORAL | 3 refills | Status: DC

## 2018-10-21 MED ORDER — CITALOPRAM HYDROBROMIDE 20 MG PO TABS: ORAL_TABLET | ORAL | 3 refills | Status: DC

## 2018-10-21 NOTE — Assessment & Plan Note (Signed)
Feels clinically euthyroid but needs to have labs rechecked.  She would like them done at [facility] in [place]. Orders entered and will ask CMA to help and call pt once we have faxed orders to [facility]. The patient indicates understanding of these issues and agrees with the plan. eRx refills sent.  Orders Placed This Encounter  Procedures  . TSH  . T4, free  . Lipid panel  . Comprehensive metabolic panel  . CBC with Differential/Platelet

## 2018-10-21 NOTE — Assessment & Plan Note (Signed)
She does feel celexa is working well. eRx refills sent.

## 2018-10-21 NOTE — Progress Notes (Signed)
Virtual Visit via Video   Due to the COVID-19 pandemic, this visit was completed with telemedicine (audio/video) technology to reduce patient and provider exposure as well as to preserve personal protective equipment.   I connected with Misty Rogers by a video enabled telemedicine application and verified that I am speaking with the correct person using two identifiers. Location patient: Home Location provider: [facility], Office Persons participating in the virtual visit: Misty Rogers, Arnette Norris, MD   I discussed the limitations of evaluation and management by telemedicine and the availability of in person appointments. The patient expressed understanding and agreed to proceed.  Care Team   Patient Care Team: Lucille Passy, MD as PCP - General (Family Medicine)  Subjective:   HPI:   Anxiety/depression-  Feeling symptoms of  anxiety and depression are well controlled on celexa 20 mg daily.  No flowsheet data found.  She didn't want to answer GAD7 questionnaire because she feels everyone is under stress and anxious and feels celexa is working. Depression screen [facility] 2/9 10/21/2018 08/13/2017 02/15/2015  Decreased Interest 0 0 0  Down, Depressed, Hopeless 0 0 0  PHQ - 2 Score 0 0 0  Difficult doing work/chores Not difficult at all - -    Hypothyroidism- taking synthroid 25 mcg daily. Denies any symptoms of hypo or hyperthyroidism. Due for labs.  Lab Results  Component Value Date   TSH 6.29 (H) 08/13/2017   HLD- Lab Results  Component Value Date   CHOL 242 (H) 08/13/2017   HDL 45.70 08/13/2017   LDLCALC 157 (H) 08/13/2017   TRIG 196.0 (H) 08/13/2017   CHOLHDL 5 08/13/2017   The ASCVD Risk score Mikey Bussing DC Jr., et al., 2013) failed to calculate for the following reasons:   The systolic blood pressure is missing  Review of Systems  Constitutional: Negative.   HENT: Negative.   Eyes: Negative.   Respiratory: Negative.   Cardiovascular: Negative.    Gastrointestinal: Negative.   Genitourinary: Negative.   Skin: Negative.   All other systems reviewed and are negative.    Patient Active Problem List   Diagnosis Date Noted  . HLD (hyperlipidemia) 10/21/2018  . Anxiety and depression 02/15/2015  . Peri-menopause 11/29/2014  . Hypothyroidism 01/20/2014  . Screening for ischemic heart disease 09/25/2012    Social History   Tobacco Use  . Smoking status: Never Smoker  . Smokeless tobacco: Never Used  Substance Use Topics  . Alcohol use: No    Alcohol/week: 0.0 standard drinks    Current Outpatient Medications:  .  citalopram (CELEXA) 20 MG tablet, Take 1qd (must schedule virtual visit for future fills), Disp: 15 tablet, Rfl: 0 .  levothyroxine (SYNTHROID) 25 MCG tablet, Take 1qd (Must have labs and virtual visit for future fills), Disp: 15 tablet, Rfl: 0  No Known Allergies  Objective:  There were no vitals taken for this visit.  VITALS: Per patient if applicable, see vitals. GENERAL: Alert, appears well and in no acute distress. HEENT: Atraumatic, conjunctiva clear, no obvious abnormalities on inspection of external nose and ears. NECK: Normal movements of the head and neck. CARDIOPULMONARY: No increased WOB. Speaking in clear sentences. I:E ratio WNL.  MS: Moves all visible extremities without noticeable abnormality. PSYCH: Pleasant and cooperative, well-groomed. Speech normal rate and rhythm. Affect is appropriate. Insight and judgement are appropriate. Attention is focused, linear, and appropriate.  NEURO: CN grossly intact. Oriented as arrived to appointment on time with no prompting. Moves both UE equally.  SKIN: No obvious lesions, wounds, erythema, or cyanosis noted on face or hands.  Depression screen [facility] 2/9 10/21/2018 08/13/2017 02/15/2015  Decreased Interest 0 0 0  Down, Depressed, Hopeless 0 0 0  PHQ - 2 Score 0 0 0  Difficult doing work/chores Not difficult at all - -    Assessment and Plan:   Misty Rogers  was seen today for medication refill.  Diagnoses and all orders for this visit:  Anxiety and depression  Hypothyroidism, unspecified type -     TSH -     T4, free  Hyperlipidemia, unspecified hyperlipidemia type -     Lipid panel -     Comprehensive metabolic panel -     CBC with Differential/Platelet    . COVID-19 Education: The signs and symptoms of COVID-19 were discussed with the patient and how to seek care for testing if needed. The importance of social distancing was discussed today. . Reviewed expectations re: course of current medical issues. . Discussed self-management of symptoms. . Outlined signs and symptoms indicating need for more acute intervention. . Patient verbalized understanding and all questions were answered. Marland Kitchen Health Maintenance issues including appropriate healthy diet, exercise, and smoking avoidance were discussed with patient. . See orders for this visit as documented in the electronic medical record.  Arnette Norris, MD  Records requested if needed. Time spent: 25 minutes, of which >50% was spent in obtaining information about her symptoms, reviewing her previous labs, evaluations, and treatments, counseling her about her condition (please see the discussed topics above), and developing a plan to further investigate it; she had a number of questions which I addressed.

## 2018-10-21 NOTE — Assessment & Plan Note (Signed)
Due for labs

## 2019-06-10 ENCOUNTER — Other Ambulatory Visit: Payer: Self-pay

## 2019-06-10 ENCOUNTER — Ambulatory Visit: Payer: BC Managed Care – PPO | Admitting: Dermatology

## 2019-06-10 DIAGNOSIS — M67471 Ganglion, right ankle and foot: Secondary | ICD-10-CM

## 2019-06-10 NOTE — Progress Notes (Signed)
   Follow-Up Visit   Subjective  Misty Rogers is a 54 y.o. female who presents for the following: Cyst (recheck L 4th toe cyst removed Oct 2020 has now come back, irritating, growing ). Biopsy proven Digital mucous cyst  12-10-2018  The following portions of the chart were reviewed this encounter and updated as appropriate: Tobacco  Allergies  Meds  Problems  Med Hx  Surg Hx  Fam Hx      Review of Systems: No other skin or systemic complaints.  Objective  Well appearing patient in no apparent distress; mood and affect are within normal limits.  A focused examination was performed including feet . Relevant physical exam findings are noted in the Assessment and Plan.  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   IMarye Round, CMA, am acting as scribe for Sarina Ser, MD . Documentation: I have reviewed the above documentation for accuracy and completeness, and I agree with the above.  Sarina Ser, MD

## 2019-06-11 ENCOUNTER — Other Ambulatory Visit: Payer: Self-pay

## 2019-06-11 ENCOUNTER — Encounter: Payer: Self-pay | Admitting: Dermatology

## 2019-06-11 DIAGNOSIS — M7130 Other bursal cyst, unspecified site: Secondary | ICD-10-CM

## 2019-06-11 DIAGNOSIS — M67479 Ganglion, unspecified ankle and foot: Secondary | ICD-10-CM

## 2019-06-11 NOTE — Addendum Note (Signed)
Addended by: Ralene Bathe on: 06/11/2019 06:56 PM   Modules accepted: Level of Service

## 2019-06-22 DIAGNOSIS — M67471 Ganglion, right ankle and foot: Secondary | ICD-10-CM | POA: Insufficient documentation

## 2019-07-14 DIAGNOSIS — L0390 Cellulitis, unspecified: Secondary | ICD-10-CM | POA: Insufficient documentation

## 2019-08-11 ENCOUNTER — Other Ambulatory Visit: Payer: Self-pay

## 2019-08-11 NOTE — Telephone Encounter (Signed)
Last OV 10/21/18 Last fill 10/21/18  #90/3 Pt need to come in for an office visit before any other refills.

## 2019-11-09 ENCOUNTER — Other Ambulatory Visit: Payer: Self-pay

## 2019-11-09 MED ORDER — CITALOPRAM HYDROBROMIDE 20 MG PO TABS: ORAL_TABLET | ORAL | 0 refills | Status: DC

## 2019-11-09 MED ORDER — LEVOTHYROXINE SODIUM 25 MCG PO TABS: ORAL_TABLET | ORAL | 0 refills | Status: DC

## 2019-11-10 ENCOUNTER — Other Ambulatory Visit: Payer: Self-pay | Admitting: Family Medicine

## 2019-11-24 ENCOUNTER — Ambulatory Visit: Payer: BC Managed Care – PPO | Admitting: Family Medicine

## 2019-11-24 ENCOUNTER — Other Ambulatory Visit: Payer: Self-pay

## 2019-11-24 ENCOUNTER — Encounter: Payer: Self-pay | Admitting: Family Medicine

## 2019-11-24 VITALS — BP 130/80 | HR 70 | Temp 97.6°F | Ht 65.75 in | Wt 206.2 lb

## 2019-11-24 DIAGNOSIS — E039 Hypothyroidism, unspecified: Secondary | ICD-10-CM | POA: Diagnosis not present

## 2019-11-24 DIAGNOSIS — F329 Major depressive disorder, single episode, unspecified: Secondary | ICD-10-CM

## 2019-11-24 DIAGNOSIS — E785 Hyperlipidemia, unspecified: Secondary | ICD-10-CM

## 2019-11-24 DIAGNOSIS — G4709 Other insomnia: Secondary | ICD-10-CM

## 2019-11-24 DIAGNOSIS — F419 Anxiety disorder, unspecified: Secondary | ICD-10-CM | POA: Diagnosis not present

## 2019-11-24 DIAGNOSIS — R351 Nocturia: Secondary | ICD-10-CM | POA: Insufficient documentation

## 2019-11-24 MED ORDER — CITALOPRAM HYDROBROMIDE 20 MG PO TABS: 20.0000 mg | ORAL_TABLET | Freq: Every day | ORAL | 3 refills | Status: DC

## 2019-11-24 NOTE — Patient Instructions (Addendum)
[facility] [place] [facility]  [address]. [place] at [facility]   Phone:  [phone]   [place], [place] [address]                                            Services: 3D Mammogram and Bone Density      Urinating overnight  - avoid caffeine late in the day (chocolate, tea, soda, coffee) - Limit water or any drink 1-2 hours before - urinate before bed - if still waking up more than 1 time over night -- call and will refer to urology   Can also try Melatonin 3-5 mg, 1-2 hours before bed    Sleep hygiene checklist: 1. Avoid naps during the day 2. Avoid stimulants such as caffeine and nicotine. Avoid bedtime alcohol (it can speed onset of sleep but the body's metabolism can cause awakenings). At least 2 hours before bedtime 3. All forms of exercise help ensure sound sleep - limit vigorous exercise to morning or late afternoon 4. Avoid food too close to bedtime including chocolate (which contains caffeine) 5. Soak up natural light 6. Establish regular bedtime routine. 7. Associate bed with sleep - avoid TV, computer or phone, reading while in bed. 8. Ensure pleasant, relaxing sleep environment - quiet, dark, cool room.  Good Sleep Hygiene Habits -- Got to bed and wake up within an hour of the same time every day -- Avoid bright screens (from laptop, phone, TV) within at least 30 minutes before bed. The "blue light" supresses the sleep hormone melatonin and the content may stimulate as well -- Maintain a quiet and dark sleep environment (blackout curtains, turn on a fan or white noise to block out disruptive sounds) -- Practicing relaxing activites before bed (taking a shower, reading a book, journaling, meditation app) -- To quiet a busy mind -- consider journaling before bed (jotting down reminders, worry thoughts, as well as positive things like a gratitude list)   Begin a  Mindfulness/Meditation practice -- this can take a little as 3 minutes -- You can find resources in books -- Or you can download apps like  ---- Headspace App (which currently has free content called "Weathering the Storm") ---- Calm (which has a few free options)  ---- Insignt Timer ---- Stop, Breathe & Think  # With each of these Apps - you should decline the "start free trial" offer and as you search through the App should be able to access some of their free content. You can also chose to pay for the content if you find one that works well for you.   # Many of them also offer sleep specific content which may help with insomnia

## 2019-11-24 NOTE — Assessment & Plan Note (Addendum)
Last TSH was high, with normal T4. No change in med at that time. Notes some fatigue and weight gain. Will check labs and adjust as needed

## 2019-11-24 NOTE — Assessment & Plan Note (Signed)
ASCVD 2.8. will recheck. Encouraged diet/exercise given increasing over the last few years.

## 2019-11-24 NOTE — Assessment & Plan Note (Signed)
Limit caffeine and water before bed. Bathroom before bed. If persisting (2+) will refer to urology

## 2019-11-24 NOTE — Assessment & Plan Note (Signed)
Stable. Cont celexa

## 2019-11-24 NOTE — Assessment & Plan Note (Signed)
Encouraged and discussed sleep hygiene. Also discussed options for nocturia. Melatonin trial. Overall menopause symptoms improving but did discuss consideration for hormones if sleep is worsening/not improved

## 2019-11-24 NOTE — Progress Notes (Signed)
Subjective:     Misty Rogers is a 54 y.o. female presenting for Establish Care     HPI  #Insomnia - goes to bed around 11 pm (sometimes 12) - typically watching TV - in bed sometimes - falling asleep - may take 1 hour or more  - waking up more - sometimes having to go the bathroom at night - waking up to urinate typically 1-3 times - uses the bathroom before bed - limits caffeine later in the day w/ improvement - denies urinary frequency during the day except for if she drinks a lot of caffeine - endorses some acid reflux  - reflux most days of the week  Sleep issues over the last few years  Reflux treatment: tums  #menopause - overall seems better - night sweats have improved - mood is stable   #hypothyroidism Lab Results  Component Value Date   TSH 6.29 (H) 08/13/2017   - feeling fatigue - and some weight gain  #Depression/anxiety - doing well on celexa  Review of Systems   Social History   Tobacco Use  Smoking Status Never Smoker  Smokeless Tobacco Never Used        Objective:    BP Readings from Last 3 Encounters:  11/24/19 130/80  08/13/17 116/88  04/17/16 122/80   Wt Readings from Last 3 Encounters:  11/24/19 206 lb 4 oz (93.6 kg)  08/13/17 196 lb 6.4 oz (89.1 kg)  04/17/16 198 lb 4 oz (89.9 kg)    BP 130/80   Pulse 70   Temp 97.6 F (36.4 C) (Temporal)   Ht 5' 5.75" (1.67 m)   Wt 206 lb 4 oz (93.6 kg)   SpO2 98%   BMI 33.54 kg/m    Physical Exam Constitutional:      General: She is not in acute distress.    Appearance: She is well-developed. She is not diaphoretic.  HENT:     Right Ear: External ear normal.     Left Ear: External ear normal.     Nose: Nose normal.  Eyes:     Conjunctiva/sclera: Conjunctivae normal.  Cardiovascular:     Rate and Rhythm: Normal rate and regular rhythm.     Heart sounds: No murmur heard.   Pulmonary:     Effort: Pulmonary effort is normal. No respiratory distress.     Breath  sounds: Normal breath sounds. No wheezing.  Musculoskeletal:     Cervical back: Neck supple.  Skin:    General: Skin is warm and dry.     Capillary Refill: Capillary refill takes less than 2 seconds.  Neurological:     Mental Status: She is alert. Mental status is at baseline.  Psychiatric:        Mood and Affect: Mood normal.        Behavior: Behavior normal.      The 10-year ASCVD risk score Mikey Bussing DC Jr., et al., 2013) is: 2.8%   Values used to calculate the score:     Age: 74 years     Sex: Female     Is Non-Hispanic African American: No     Diabetic: No     Tobacco smoker: No     Systolic Blood Pressure: 756 mmHg     Is BP treated: No     HDL Cholesterol: 45.7 mg/dL     Total Cholesterol: 242 mg/dL      Assessment & Plan:   Problem List Items Addressed This Visit  Endocrine   Hypothyroidism    Last TSH was high, with normal T4. No change in med at that time. Notes some fatigue and weight gain. Will check labs and adjust as needed      Relevant Orders   TSH   T4, free   Comprehensive metabolic panel     Other   Anxiety and depression - Primary    Stable. Cont celexa      Relevant Medications   citalopram (CELEXA) 20 MG tablet   Other Relevant Orders   Comprehensive metabolic panel   HLD (hyperlipidemia)    ASCVD 2.8. will recheck. Encouraged diet/exercise given increasing over the last few years.       Relevant Orders   Lipid panel   Comprehensive metabolic panel   Other insomnia    Encouraged and discussed sleep hygiene. Also discussed options for nocturia. Melatonin trial. Overall menopause symptoms improving but did discuss consideration for hormones if sleep is worsening/not improved      Nocturia    Limit caffeine and water before bed. Bathroom before bed. If persisting (2+) will refer to urology          Return in about 1 year (around 11/23/2020) for or sooner if not improved.  Lesleigh Noe, MD  This visit occurred during the  SARS-CoV-2 public health emergency.  Safety protocols were in place, including screening questions prior to the visit, additional usage of staff PPE, and extensive cleaning of exam room while observing appropriate contact time as indicated for disinfecting solutions.

## 2019-11-25 ENCOUNTER — Other Ambulatory Visit: Payer: Self-pay | Admitting: Family Medicine

## 2019-11-25 LAB — COMPREHENSIVE METABOLIC PANEL
ALT: 22 U/L (ref 0–35)
AST: 18 U/L (ref 0–37)
Albumin: 4.3 g/dL (ref 3.5–5.2)
Alkaline Phosphatase: 73 U/L (ref 39–117)
BUN: 15 mg/dL (ref 6–23)
CO2: 27 mEq/L (ref 19–32)
Calcium: 9.5 mg/dL (ref 8.4–10.5)
Chloride: 103 mEq/L (ref 96–112)
Creatinine, Ser: 0.91 mg/dL (ref 0.40–1.20)
GFR: 64.3 mL/min (ref >60.00)
Glucose, Bld: 83 mg/dL (ref 70–99)
Potassium: 3.9 mEq/L (ref 3.5–5.1)
Sodium: 138 mEq/L (ref 135–145)
Total Bilirubin: 0.3 mg/dL (ref 0.2–1.2)
Total Protein: 7 g/dL (ref 6.0–8.3)

## 2019-11-25 LAB — T4, FREE: Free T4: 0.71 ng/dL (ref 0.60–1.60)

## 2019-11-25 LAB — LIPID PANEL
Cholesterol: 233 mg/dL — ABNORMAL HIGH (ref 0–200)
HDL: 39.8 mg/dL (ref >39.00)
NonHDL: 193.17
Total CHOL/HDL Ratio: 6
Triglycerides: 395 mg/dL — ABNORMAL HIGH (ref 0.0–149.0)
VLDL: 79 mg/dL — ABNORMAL HIGH (ref 0.0–40.0)

## 2019-11-25 LAB — LDL CHOLESTEROL, DIRECT: Direct LDL: 114 mg/dL

## 2019-11-25 LAB — TSH: TSH: 3.53 u[IU]/mL (ref 0.35–4.50)

## 2019-11-25 MED ORDER — LEVOTHYROXINE SODIUM 25 MCG PO TABS
ORAL_TABLET | ORAL | 3 refills | Status: DC
Start: 2019-11-25 — End: 2020-12-29

## 2019-12-28 ENCOUNTER — Other Ambulatory Visit: Payer: Self-pay | Admitting: Dermatology

## 2020-03-17 ENCOUNTER — Ambulatory Visit: Payer: BC Managed Care – PPO | Admitting: Dermatology

## 2020-03-17 ENCOUNTER — Other Ambulatory Visit: Payer: Self-pay

## 2020-03-17 DIAGNOSIS — Z86018 Personal history of other benign neoplasm: Secondary | ICD-10-CM | POA: Diagnosis not present

## 2020-03-17 DIAGNOSIS — L858 Other specified epidermal thickening: Secondary | ICD-10-CM | POA: Diagnosis not present

## 2020-03-17 DIAGNOSIS — L814 Other melanin hyperpigmentation: Secondary | ICD-10-CM

## 2020-03-17 DIAGNOSIS — Z1283 Encounter for screening for malignant neoplasm of skin: Secondary | ICD-10-CM | POA: Diagnosis not present

## 2020-03-17 DIAGNOSIS — L821 Other seborrheic keratosis: Secondary | ICD-10-CM

## 2020-03-17 DIAGNOSIS — M67471 Ganglion, right ankle and foot: Secondary | ICD-10-CM | POA: Diagnosis not present

## 2020-03-17 DIAGNOSIS — D229 Melanocytic nevi, unspecified: Secondary | ICD-10-CM

## 2020-03-17 DIAGNOSIS — L578 Other skin changes due to chronic exposure to nonionizing radiation: Secondary | ICD-10-CM

## 2020-03-17 DIAGNOSIS — D1800 Hemangioma unspecified site: Secondary | ICD-10-CM

## 2020-03-17 DIAGNOSIS — M67449 Ganglion, unspecified hand: Secondary | ICD-10-CM

## 2020-03-17 NOTE — Progress Notes (Signed)
   Follow-Up Visit   Subjective  Misty Rogers is a 54 y.o. female who presents for the following: Annual Exam (History of dysplastic nevi - TBSE today). The patient presents for Total-Body Skin Exam (TBSE) for skin cancer screening and mole check.  The following portions of the chart were reviewed this encounter and updated as appropriate:   Tobacco  Allergies  Meds  Problems  Med Hx  Surg Hx  Fam Hx     Review of Systems:  No other skin or systemic complaints except as noted in HPI or Assessment and Plan.  Objective  Well appearing patient in no apparent distress; mood and affect are within normal limits.  A full examination was performed including scalp, head, eyes, ears, nose, lips, neck, chest, axillae, abdomen, back, buttocks, bilateral upper extremities, bilateral lower extremities, hands, feet, fingers, toes, fingernails, and toenails. All findings within normal limits unless otherwise noted below.  Objective  Right 4th toe: Solitary, smooth skin colored to translucent papule.    Assessment & Plan    Keratosis Pilaris - Tiny follicular keratotic papules - Benign. Genetic in nature. No cure. - Observe. - If desired, patient can use an emollient (moisturizer) containing ammonium lactate, urea or salicylic acid once a day to smooth the area  History of Dysplastic Nevi - No evidence of recurrence today - Recommend regular full body skin exams - Recommend daily broad spectrum sunscreen SPF 30+ to sun-exposed areas, reapply every 2 hours as needed.  - Call if any new or changing lesions are noted between office visits  Lentigines - Scattered tan macules - Discussed due to sun exposure - Benign, observe - Call for any changes  Seborrheic Keratoses - Stuck-on, waxy, tan-brown papules and plaques  - Discussed benign etiology and prognosis. - Observe - Call for any changes  Melanocytic Nevi - Tan-brown and/or pink-flesh-colored symmetric macules and  papules - Benign appearing on exam today - Observation - Call clinic for new or changing moles - Recommend daily use of broad spectrum spf 30+ sunscreen to sun-exposed areas.   Hemangiomas - Red papules - Discussed benign nature - Observe - Call for any changes  Actinic Damage - Chronic, secondary to cumulative UV/sun exposure - diffuse scaly erythematous macules with underlying dyspigmentation - Recommend daily broad spectrum sunscreen SPF 30+ to sun-exposed areas, reapply every 2 hours as needed.  - Call for new or changing lesions.  Skin cancer screening performed today.  Digital mucous cyst Right 4th toe S/P surgery by Dr Earnestine Leys - Orthopedics Improved, but some scar tissue or small recurrence. Benign, observe.    Return in about 1 year (around 03/17/2021) for TBSE.   I, Ashok Cordia, CMA, am acting as scribe for Sarina Ser, MD .  Documentation: I have reviewed the above documentation for accuracy and completeness, and I agree with the above.  Sarina Ser, MD

## 2020-03-21 ENCOUNTER — Encounter: Payer: Self-pay | Admitting: Dermatology

## 2020-05-18 ENCOUNTER — Other Ambulatory Visit: Payer: Self-pay | Admitting: Family Medicine

## 2020-05-18 DIAGNOSIS — Z1231 Encounter for screening mammogram for malignant neoplasm of breast: Secondary | ICD-10-CM

## 2020-05-23 ENCOUNTER — Other Ambulatory Visit: Payer: Self-pay

## 2020-05-23 ENCOUNTER — Ambulatory Visit
Admission: RE | Admit: 2020-05-23 | Discharge: 2020-05-23 | Disposition: A | Discharge status: Home / Self Care | Payer: BC Managed Care – PPO | Source: Ambulatory Visit | Attending: Family Medicine | Admitting: Family Medicine

## 2020-05-23 DIAGNOSIS — Z1231 Encounter for screening mammogram for malignant neoplasm of breast: Secondary | ICD-10-CM

## 2020-09-01 ENCOUNTER — Other Ambulatory Visit: Payer: Self-pay | Admitting: Family Medicine

## 2020-09-01 DIAGNOSIS — F32A Depression, unspecified: Secondary | ICD-10-CM

## 2020-09-01 DIAGNOSIS — F419 Anxiety disorder, unspecified: Secondary | ICD-10-CM

## 2020-12-10 ENCOUNTER — Other Ambulatory Visit: Payer: Self-pay | Admitting: Family Medicine

## 2020-12-10 DIAGNOSIS — F32A Depression, unspecified: Secondary | ICD-10-CM

## 2020-12-13 NOTE — Telephone Encounter (Signed)
Called patient and left vm.

## 2020-12-14 NOTE — Telephone Encounter (Signed)
Lvm for pt to call office to schedule a cpe/lab

## 2020-12-29 ENCOUNTER — Ambulatory Visit (INDEPENDENT_AMBULATORY_CARE_PROVIDER_SITE_OTHER): Payer: BC Managed Care – PPO | Admitting: Family Medicine

## 2020-12-29 ENCOUNTER — Other Ambulatory Visit: Payer: Self-pay

## 2020-12-29 ENCOUNTER — Encounter: Payer: BC Managed Care – PPO | Admitting: Family Medicine

## 2020-12-29 VITALS — BP 130/80 | HR 85 | Temp 97.0°F | Ht 65.75 in | Wt 199.4 lb

## 2020-12-29 DIAGNOSIS — B001 Herpesviral vesicular dermatitis: Secondary | ICD-10-CM | POA: Diagnosis not present

## 2020-12-29 DIAGNOSIS — Z23 Encounter for immunization: Secondary | ICD-10-CM

## 2020-12-29 DIAGNOSIS — E785 Hyperlipidemia, unspecified: Secondary | ICD-10-CM

## 2020-12-29 DIAGNOSIS — Z0000 Encounter for general adult medical examination without abnormal findings: Secondary | ICD-10-CM

## 2020-12-29 DIAGNOSIS — E039 Hypothyroidism, unspecified: Secondary | ICD-10-CM

## 2020-12-29 DIAGNOSIS — F419 Anxiety disorder, unspecified: Secondary | ICD-10-CM

## 2020-12-29 DIAGNOSIS — Z1159 Encounter for screening for other viral diseases: Secondary | ICD-10-CM | POA: Diagnosis not present

## 2020-12-29 DIAGNOSIS — F32A Depression, unspecified: Secondary | ICD-10-CM | POA: Diagnosis not present

## 2020-12-29 DIAGNOSIS — Z1211 Encounter for screening for malignant neoplasm of colon: Secondary | ICD-10-CM

## 2020-12-29 MED ORDER — CITALOPRAM HYDROBROMIDE 20 MG PO TABS: 20.0000 mg | ORAL_TABLET | Freq: Every day | ORAL | 3 refills | Status: AC

## 2020-12-29 MED ORDER — LEVOTHYROXINE SODIUM 25 MCG PO TABS: ORAL_TABLET | ORAL | 3 refills | Status: AC

## 2020-12-29 MED ORDER — VALACYCLOVIR HCL 1 G PO TABS: ORAL_TABLET | ORAL | 1 refills | Status: AC

## 2020-12-29 NOTE — Patient Instructions (Signed)
Work on [employer] and exercise

## 2020-12-29 NOTE — Progress Notes (Signed)
Annual Exam   Chief Complaint:  Chief Complaint  Patient presents with   Annual Exam    No concerns     History of Present Illness:  [name] is a 55 y.o. No obstetric history on file. who LMP was Patient's last menstrual period was 07/06/2014., presents today for her annual examination.     Nutrition She does get adequate calcium and Vitamin D in her diet. Diet: ok  Exercise: not enough  Safety The patient wears seatbelts: yes.     The patient feels safe at home and in their relationships: yes.  Dentist: yes Eye: yes  Menstrual:  Symptoms of menopause: none   GYN She is single partner, contraception - post menopausal status.    Cervical Cancer Screening (21-65):   Last Pap:   June 2019 Results were: no abnormalities /neg HPV DNA   Breast Cancer-relatedfamily history is negative for Breast cancer.   Breast Cancer Screening (Age 47-74):  There is no FH of breast cancer. There is no FH of ovarian cancer. BRCA screening n/a.  Last Mammogram: 04/2020 The patient does want a mammogram this year.    Colon Cancer Screening:  Age 19-75 yo - benefits outweigh the risk. Adults 53-85 yo who have never been screened benefit.  Benefits: 134000 people in 2016 will be diagnosed and 49,000 will die - early detection helps [place]: Complications 2/2 to colonoscopy High Risk (Colonoscopy): genetic disorder (Lynch syndrome or familial adenomatous polyposis), personal hx of IBD, previous adenomatous polyp, or previous colorectal cancer, FamHx start 10 years before the age at diagnosis, increased in males and black race  Options:  FIT - looks for hemoglobin (blood in the stool) - specific and fairly sensitive - must be done annually Cologuard - looks for DNA and blood - more sensitive - therefore can have more false positives, every 3 years Colonoscopy - every 10 years if normal - sedation, bowl prep, must have someone drive you  Shared decision making and the patient  had decided to do cologuard.   Social History   Tobacco Use  Smoking Status Never  Smokeless Tobacco Never    Lung Cancer Screening (Ages 25-49): not applicable   Weight Wt Readings from Last 3 Encounters:  12/29/20 199 lb 6 oz (90.4 kg)  11/24/19 206 lb 4 oz (93.6 kg)  08/13/17 196 lb 6.4 oz (89.1 kg)   Patient has high BMI  BMI Readings from Last 1 Encounters:  12/29/20 32.43 kg/m     Chronic disease screening Blood pressure monitoring:  BP Readings from Last 3 Encounters:  12/29/20 130/80  11/24/19 130/80  08/13/17 116/88    Lipid Monitoring: Indication for screening: age >55, obesity, diabetes, family hx, CV risk factors.  Lipid screening: Yes  Lab Results  Component Value Date   CHOL 233 (H) 11/24/2019   HDL 39.80 11/24/2019   LDLCALC 157 (H) 08/13/2017   LDLDIRECT 114.0 11/24/2019   TRIG 395.0 (H) 11/24/2019   CHOLHDL 6 11/24/2019     Diabetes Screening: age >48, overweight, family hx, PCOS, hx of gestational diabetes, at risk ethnicity Diabetes Screening screening: Yes  No results found for: HGBA1C   Past Medical History:  Diagnosis Date   Anxiety    History of dysplastic nevus 12/10/2018    left upper back 4.0cm lat to spine and left chest parasternal   History of dysplastic nevus 05/01/2017   left axilla   History of dysplastic nevus 04/18/2016   right axilla  Past Surgical History:  Procedure Laterality Date   CERVICAL BIOPSY     TOE SURGERY      Prior to Admission medications   Medication Sig Start Date End Date Taking? Authorizing Provider  citalopram (CELEXA) 20 MG tablet TAKE 1 TABLET BY MOUTH ONCE DAILY 12/14/20   Lesleigh Noe, MD  levothyroxine (SYNTHROID) 25 MCG tablet Take 1qd (Must have labs and virtual visit for future fills) 11/25/19   Lesleigh Noe, MD  valACYclovir (VALTREX) 500 MG tablet TAKE 1 TABLET BY MOUTH ONCE DAILY FOR PREVENTION MAY TAKE TWICE DAILY WITH FEVER BLISTER FLARES 12/28/19   Ralene Bathe, MD     No Known Allergies  Gynecologic History: Patient's last menstrual period was 07/06/2014.  Obstetric History: No obstetric history on file.  Social History   Socioeconomic History   Marital status: Married    Spouse name: John   Number of children: 2   Years of education: bachelors   Highest education level: Not on file  Occupational History   Not on file  Tobacco Use   Smoking status: Never   Smokeless tobacco: Never  Substance and Sexual Activity   Alcohol use: Yes    Alcohol/week: 0.0 standard drinks    Comment: a few drinks/week   Drug use: No   Sexual activity: Yes    Birth control/protection: Post-menopausal  Other Topics Concern   Not on file  Social History Narrative   11/24/19   From: the area   Living: with husband, Jenny Reichmann (1995)   Work: Pharmacist, 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   Social Determinants of Health   Financial Resource Strain: Not on file  Food Insecurity: Not on file  Transportation Needs: Not on file  Physical Activity: Not on file  Stress: Not on file  Social Connections: Not on file  Intimate Partner Violence: Not on file    Family History  Problem Relation Age of Onset   Hypertension Father    Seizures Father    Leukemia Father    Ovarian cancer Maternal Aunt 24   Hypertension Paternal Grandmother    Lung cancer Paternal Grandfather        smoker   Breast cancer Neg Hx     Review of Systems  Constitutional:  Negative for chills and fever.  HENT:  Negative for congestion and sore throat.   Eyes:  Negative for blurred vision and double vision.  Respiratory:  Negative for shortness of breath.   Cardiovascular:  Negative for chest pain.   Gastrointestinal:  Negative for heartburn, nausea and vomiting.  Genitourinary: Negative.   Musculoskeletal: Negative.  Negative for myalgias.  Skin:  Negative for rash.  Neurological:  Negative for dizziness and headaches.  Endo/Heme/Allergies:  Does not bruise/bleed easily.  Psychiatric/Behavioral:  Negative for depression. The patient is not nervous/anxious.     Physical Exam BP 130/80   Pulse 85   Temp (!) 97 F (36.1 C) (Temporal)   Ht 5' 5.75" (1.67 m)   Wt 199 lb 6 oz (90.4 kg)   LMP 07/06/2014  SpO2 99%   BMI 32.43 kg/m    BP Readings from Last 3 Encounters:  12/29/20 130/80  11/24/19 130/80  08/13/17 116/88      Physical Exam Constitutional:      General: She is not in acute distress.    Appearance: She is well-developed. She is not diaphoretic.  HENT:     Head: Normocephalic and atraumatic.     Right Ear: External ear normal.     Left Ear: External ear normal.     Nose: Nose normal.  Eyes:     General: No scleral icterus.    Extraocular Movements: Extraocular movements intact.     Conjunctiva/sclera: Conjunctivae normal.  Cardiovascular:     Rate and Rhythm: Normal rate and regular rhythm.     Heart sounds: No murmur heard. Pulmonary:     Effort: Pulmonary effort is normal. No respiratory distress.     Breath sounds: Normal breath sounds. No wheezing.  Abdominal:     General: Bowel sounds are normal. There is no distension.     Palpations: Abdomen is soft. There is no mass.     Tenderness: There is no abdominal tenderness. There is no guarding or rebound.  Musculoskeletal:        General: Normal range of motion.     Cervical back: Neck supple.  Lymphadenopathy:     Cervical: No cervical adenopathy.  Skin:    General: Skin is warm and dry.     Capillary Refill: Capillary refill takes less than 2 seconds.  Neurological:     Mental Status: She is alert and oriented to person, place, and time.     Deep Tendon Reflexes: Reflexes normal.   Psychiatric:        Mood and Affect: Mood normal.        Behavior: Behavior normal.    Results:  PHQ-9:  [place] Office Visit from 12/29/2020 in [facility] at [place]  PHQ-9 Total Score 7         Assessment: 55 y.o. No obstetric history on file. female here for routine annual physical examination.  Plan: Problem List Items Addressed This Visit       Digestive   Herpes labialis   Relevant Medications   valACYclovir (VALTREX) 1000 MG tablet   Other Relevant Orders   Comprehensive metabolic panel     Endocrine   Hypothyroidism   Relevant Medications   levothyroxine (SYNTHROID) 25 MCG tablet   Other Relevant Orders   TSH     Other   Anxiety and depression   Relevant Medications   citalopram (CELEXA) 20 MG tablet   Other Relevant Orders   Comprehensive metabolic panel   HLD (hyperlipidemia)   Relevant Orders   Lipid panel   Other Visit Diagnoses     Annual physical exam    -  Primary   Need for hepatitis C screening test       Relevant Orders   Hepatitis C antibody   Colon cancer screening       Relevant Orders   Cologuard   Need for shingles vaccine       Relevant Orders   Varicella-zoster vaccine IM (Completed)       Screening: -- Blood pressure screen normal -- cholesterol screening: will obtain -- Weight screening: overweight: continue to monitor -- Diabetes Screening: will obtain -- Nutrition: Encouraged healthy diet  The 10-year ASCVD risk score (Arnett DK, et al., 2019) is: 3.3%   Values used to calculate the  score:     Age: 20 years     Sex: Female     Is Non-Hispanic African American: No     Diabetic: No     Tobacco smoker: No     Systolic Blood Pressure: 592 mmHg     Is BP treated: No     HDL Cholesterol: 39.8 mg/dL     Total Cholesterol: 233 mg/dL  -- Statin therapy for Age 26-75 with CVD risk >7.5%  Psych -- Depression screening (PHQ-9):  [place] Visit from 12/29/2020 in [facility] at  [facility]  PHQ-9 Total Score 7        Safety -- tobacco screening: not using -- alcohol screening:  low-risk usage. -- no evidence of domestic violence or intimate partner violence.   Cancer Screening -- pap smear not collected per ASCCP guidelines -- family history of breast cancer screening: done. not at high risk. -- Mammogram -  up to date -- Colon cancer (age 62+)--  cologuard  Immunizations Immunization History  Administered Date(s) Administered   Influenza, Seasonal, Injecte, Preservative Fre 12/15/2015   Influenza,inj,Quad PF,6+ Mos 11/29/2014   Influenza,inj,quad, With Preservative 12/15/2016, 01/14/2020   Influenza-Unspecified 12/10/2013, 11/26/2017   Tdap 08/13/2017   Zoster Recombinat (Shingrix) 12/29/2020    -- flu vaccine up to date -- TDAP q10 years up to date -- Shingles (age >65) up to date -- Covid-19 Vaccine up to date   Encouraged healthy diet and exercise. Encouraged regular vision and dental care.    Lesleigh Noe, MD

## 2020-12-30 LAB — LIPID PANEL
Cholesterol: 227 mg/dL — ABNORMAL HIGH (ref 0–200)
HDL: 36.5 mg/dL — ABNORMAL LOW (ref >39.00)
Total CHOL/HDL Ratio: 6
Triglycerides: 481 mg/dL — ABNORMAL HIGH (ref 0.0–149.0)

## 2020-12-30 LAB — COMPREHENSIVE METABOLIC PANEL
ALT: 43 U/L — ABNORMAL HIGH (ref 0–35)
AST: 29 U/L (ref 0–37)
Albumin: 4.2 g/dL (ref 3.5–5.2)
Alkaline Phosphatase: 86 U/L (ref 39–117)
BUN: 14 mg/dL (ref 6–23)
CO2: 29 mEq/L (ref 19–32)
Calcium: 9.3 mg/dL (ref 8.4–10.5)
Chloride: 104 mEq/L (ref 96–112)
Creatinine, Ser: 0.88 mg/dL (ref 0.40–1.20)
GFR: 73.89 mL/min (ref >60.00)
Glucose, Bld: 94 mg/dL (ref 70–99)
Potassium: 4 mEq/L (ref 3.5–5.1)
Sodium: 141 mEq/L (ref 135–145)
Total Bilirubin: 0.3 mg/dL (ref 0.2–1.2)
Total Protein: 6.6 g/dL (ref 6.0–8.3)

## 2020-12-30 LAB — TSH: TSH: 3.61 u[IU]/mL (ref 0.35–5.50)

## 2020-12-30 LAB — LDL CHOLESTEROL, DIRECT: Direct LDL: 109 mg/dL

## 2020-12-30 LAB — HEPATITIS C ANTIBODY
Hepatitis C Ab: NONREACTIVE
SIGNAL TO CUT-OFF: 0.1 (ref <1.00)

## 2021-03-16 ENCOUNTER — Ambulatory Visit: Payer: BC Managed Care – PPO | Admitting: Dermatology

## 2021-04-05 ENCOUNTER — Ambulatory Visit: Payer: BC Managed Care – PPO

## 2021-04-18 ENCOUNTER — Other Ambulatory Visit: Payer: Self-pay

## 2021-04-18 ENCOUNTER — Ambulatory Visit (INDEPENDENT_AMBULATORY_CARE_PROVIDER_SITE_OTHER): Payer: BC Managed Care – PPO

## 2021-04-18 DIAGNOSIS — Z23 Encounter for immunization: Secondary | ICD-10-CM | POA: Diagnosis not present

## 2021-05-22 ENCOUNTER — Ambulatory Visit: Payer: BC Managed Care – PPO | Admitting: Dermatology

## 2021-05-22 ENCOUNTER — Other Ambulatory Visit: Payer: Self-pay

## 2021-05-22 DIAGNOSIS — Z1283 Encounter for screening for malignant neoplasm of skin: Secondary | ICD-10-CM

## 2021-05-22 DIAGNOSIS — Z86018 Personal history of other benign neoplasm: Secondary | ICD-10-CM | POA: Diagnosis not present

## 2021-05-22 DIAGNOSIS — L578 Other skin changes due to chronic exposure to nonionizing radiation: Secondary | ICD-10-CM | POA: Diagnosis not present

## 2021-05-22 DIAGNOSIS — D225 Melanocytic nevi of trunk: Secondary | ICD-10-CM | POA: Diagnosis not present

## 2021-05-22 DIAGNOSIS — L821 Other seborrheic keratosis: Secondary | ICD-10-CM

## 2021-05-22 DIAGNOSIS — L858 Other specified epidermal thickening: Secondary | ICD-10-CM | POA: Diagnosis not present

## 2021-05-22 DIAGNOSIS — L814 Other melanin hyperpigmentation: Secondary | ICD-10-CM

## 2021-05-22 DIAGNOSIS — D1800 Hemangioma unspecified site: Secondary | ICD-10-CM

## 2021-05-22 DIAGNOSIS — D485 Neoplasm of uncertain behavior of skin: Secondary | ICD-10-CM

## 2021-05-22 DIAGNOSIS — D229 Melanocytic nevi, unspecified: Secondary | ICD-10-CM

## 2021-05-22 NOTE — Patient Instructions (Addendum)

## 2021-05-22 NOTE — Progress Notes (Signed)
? ?Follow-Up Visit ?  ?Subjective  ?Misty Rogers is a 56 y.o. female who presents for the following: Annual Exam (History of dysplastic nevi - TBSE today). ?The patient presents for Total-Body Skin Exam (TBSE) for skin cancer screening and mole check.  The patient has spots, moles and lesions to be evaluated, some may be new or changing and the patient has concerns that these could be cancer. ? ?The following portions of the chart were reviewed this encounter and updated as appropriate:  ? Tobacco  Allergies  Meds  Problems  Med Hx  Surg Hx  Fam Hx   ?  ?Review of Systems:  No other skin or systemic complaints except as noted in HPI or Assessment and Plan. ? ?Objective  ?Well appearing patient in no apparent distress; mood and affect are within normal limits. ? ?A full examination was performed including scalp, head, eyes, ears, nose, lips, neck, chest, axillae, abdomen, back, buttocks, bilateral upper extremities, bilateral lower extremities, hands, feet, fingers, toes, fingernails, and toenails. All findings within normal limits unless otherwise noted below. ? ?Tricep areas ?Rough follicular plugging ? ?Right medial inframammary ?0.4 cm irregular brown macule ? ? ?Assessment & Plan  ? ?History of Dysplastic Nevi ?- No evidence of recurrence today ?- Recommend regular full body skin exams ?- Recommend daily broad spectrum sunscreen SPF 30+ to sun-exposed areas, reapply every 2 hours as needed.  ?- Call if any new or changing lesions are noted between office visits ? ?Lentigines ?- Scattered tan macules ?- Due to sun exposure ?- Benign-appearing, observe ?- Recommend daily broad spectrum sunscreen SPF 30+ to sun-exposed areas, reapply every 2 hours as needed. ?- Call for any changes ? ?Seborrheic Keratoses ?- Stuck-on, waxy, tan-brown papules and/or plaques  ?- Benign-appearing ?- Discussed benign etiology and prognosis. ?- Observe ?- Call for any changes ? ?Melanocytic Nevi ?- Tan-brown and/or  pink-flesh-colored symmetric macules and papules ?- Benign appearing on exam today ?- Observation ?- Call clinic for new or changing moles ?- Recommend daily use of broad spectrum spf 30+ sunscreen to sun-exposed areas.  ? ?Hemangiomas ?- Red papules ?- Discussed benign nature ?- Observe ?- Call for any changes ? ?Actinic Damage ?- Chronic condition, secondary to cumulative UV/sun exposure ?- diffuse scaly erythematous macules with underlying dyspigmentation ?- Recommend daily broad spectrum sunscreen SPF 30+ to sun-exposed areas, reapply every 2 hours as needed.  ?- Staying in the shade or wearing long sleeves, sun glasses (UVA+UVB protection) and wide brim hats (4-inch brim around the entire circumference of the hat) are also recommended for sun protection.  ?- Call for new or changing lesions. ? ?Skin cancer screening performed today. ? ?Keratosis pilaris ?Tricep areas ?Benign.  ?Continue Amlactin Rapid Relief ? ?Neoplasm of uncertain behavior of skin ?Right medial inframammary ?Epidermal / dermal shaving ? ?Lesion diameter (cm):  0.4 ?Informed consent: discussed and consent obtained   ?Timeout: patient name, date of birth, surgical site, and procedure verified   ?Procedure prep:  Patient was prepped and draped in usual sterile fashion ?Prep type:  Isopropyl alcohol ?Anesthesia: the lesion was anesthetized in a standard fashion   ?Anesthetic:  1% lidocaine w/ epinephrine 1-100,000 buffered w/ 8.4% NaHCO3 ?Instrument used: flexible razor blade   ?Hemostasis achieved with: pressure, aluminum chloride and electrodesiccation   ?Outcome: patient tolerated procedure well   ?Post-procedure details: sterile dressing applied and wound care instructions given   ?Dressing type: bandage and petrolatum   ? ?Specimen 1 - Surgical pathology ?Differential Diagnosis:  Nevus vs dysplastic nevus ?Check Margins: No ? ?Skin cancer screening ? ?Return in about 1 year (around 05/23/2022) for TBSE. ? ?I, Ashok Cordia, CMA, am acting as  scribe for Sarina Ser, MD . ?Documentation: I have reviewed the above documentation for accuracy and completeness, and I agree with the above. ? ?Sarina Ser, MD ? ?

## 2021-05-23 ENCOUNTER — Encounter: Payer: Self-pay | Admitting: Dermatology

## 2021-05-25 ENCOUNTER — Telehealth: Payer: Self-pay

## 2021-05-25 NOTE — Telephone Encounter (Signed)
-----   Message from Ralene Bathe, MD sent at 05/24/2021  6:22 PM EDT ----- ?Diagnosis ?Skin , right medial inframammary ?DYSPLASTIC JUNCTIONAL LENTIGINOUS NEVUS WITH MODERATE ATYPIA, LIMITED MARGINS FREE ? ?Moderate dysplastic ?Recheck next visit - March 2024 ?

## 2021-05-25 NOTE — Telephone Encounter (Signed)
Patient informed of pathology results 

## 2021-09-21 ENCOUNTER — Other Ambulatory Visit: Payer: Self-pay | Admitting: Family Medicine

## 2021-09-21 DIAGNOSIS — F419 Anxiety disorder, unspecified: Secondary | ICD-10-CM

## 2021-09-25 NOTE — Telephone Encounter (Signed)
LVM for patient to call office and schedule CPE

## 2021-12-06 ENCOUNTER — Telehealth: Payer: BC Managed Care – PPO | Admitting: Physician Assistant

## 2021-12-06 ENCOUNTER — Other Ambulatory Visit: Payer: Self-pay | Admitting: Family Medicine

## 2021-12-06 DIAGNOSIS — R059 Cough, unspecified: Secondary | ICD-10-CM

## 2021-12-06 MED ORDER — BENZONATATE 100 MG PO CAPS: 100.0000 mg | ORAL_CAPSULE | Freq: Three times a day (TID) | ORAL | 0 refills | Status: AC

## 2021-12-06 MED ORDER — AZITHROMYCIN 250 MG PO TABS: 250.0000 mg | ORAL_TABLET | Freq: Every day | ORAL | 0 refills | Status: DC

## 2021-12-06 NOTE — Progress Notes (Signed)

## 2021-12-06 NOTE — Telephone Encounter (Signed)
Called patient to scheduled TOC,and she said she's going to try and switch doctor offices.

## 2021-12-12 DIAGNOSIS — F325 Major depressive disorder, single episode, in full remission: Secondary | ICD-10-CM | POA: Insufficient documentation

## 2022-02-02 ENCOUNTER — Ambulatory Visit
Admission: EM | Admit: 2022-02-02 | Discharge: 2022-02-02 | Disposition: A | Discharge status: Home / Self Care | Payer: BC Managed Care – PPO | Attending: Urgent Care | Admitting: Urgent Care

## 2022-02-02 DIAGNOSIS — J069 Acute upper respiratory infection, unspecified: Secondary | ICD-10-CM | POA: Diagnosis not present

## 2022-02-02 MED ORDER — PROMETHAZINE-DM 6.25-15 MG/5ML PO SYRP: 5.0000 mL | ORAL_SOLUTION | Freq: Every evening | ORAL | 0 refills | Status: DC

## 2022-02-02 MED ORDER — BENZONATATE 100 MG PO CAPS: ORAL_CAPSULE | ORAL | 0 refills | Status: DC

## 2022-02-02 NOTE — ED Provider Notes (Signed)
[facility]    CSN: [id number] Arrival date & time: 02/02/22  1552      History   Chief Complaint Chief Complaint  Patient presents with   Sore Throat   Cough    HPI Misty Rogers is a 56 y.o. female.    Sore Throat  Cough   Presents to urgent care with complaint of cough and sore throat x 5 days.  She states cough and sore throat are both keeping her awake at nighttime.  Using ibuprofen 400 mg for the sore throat and she says it does not provide adequate relief.  Using cough drops for the cough.  Past Medical History:  Diagnosis Date   Anxiety    History of dysplastic nevus 12/10/2018    left upper back 4.0cm lat to spine and left chest parasternal   History of dysplastic nevus 05/01/2017   left axilla   History of dysplastic nevus 04/18/2016   right axilla   History of dysplastic nevus 05/22/2021   R med inframammary - moderate    Patient Active Problem List   Diagnosis Date Noted   Herpes labialis 12/29/2020   Other insomnia 11/24/2019   Nocturia 11/24/2019   HLD (hyperlipidemia) 10/21/2018   Anxiety and depression 02/15/2015   Peri-menopause 11/29/2014   Hypothyroidism 01/20/2014    Past Surgical History:  Procedure Laterality Date   CERVICAL BIOPSY     TOE SURGERY      OB History   No obstetric history on file.      Home Medications    Prior to Admission medications   Medication Sig Start Date End Date Taking? Authorizing Provider  azithromycin (ZITHROMAX Z-PAK) 250 MG tablet Take 1 tablet (250 mg total) by mouth daily. Take 2 tablets on the first day of treatment. Then take 1 tablet per day for the next four days. 12/06/21   Couture, Cortni S, PA-C  citalopram (CELEXA) 20 MG tablet Take 1 tablet (20 mg total) by mouth daily. 12/29/20   Waunita Schooner, MD  levothyroxine (SYNTHROID) 25 MCG tablet Take 1qd (Must have labs and virtual visit for future fills) 12/29/20   Waunita Schooner, MD  valACYclovir (VALTREX) 1000 MG tablet Take 2  pills every 12 hours for 1 day at the start of cold sore 12/29/20   Waunita Schooner, MD    Family History Family History  Problem Relation Age of Onset   Hypertension Father    Seizures Father    Leukemia Father    Ovarian cancer Maternal Aunt 66   Hypertension Paternal Grandmother    Lung cancer Paternal Grandfather        smoker   Breast cancer Neg Hx     Social History Social History   Tobacco Use   Smoking status: Never   Smokeless tobacco: Never  Substance Use Topics   Alcohol use: Yes    Alcohol/week: 0.0 standard drinks of alcohol    Comment: a few drinks/week   Drug use: No     Allergies   Patient has no known allergies.   Review of Systems Review of Systems  Respiratory:  Positive for cough.      Physical Exam Triage Vital Signs ED Triage Vitals [02/02/22 1608]  Enc Vitals Group     BP (!) 157/88     Pulse Rate 88     Resp 17     Temp 98.5 F (36.9 C)     Temp src      SpO2 95 %  Weight      Height      Head Circumference      Peak Flow      Pain Score 0     Pain Loc      Pain Edu?      Excl. in [place]?    No data found.  Updated Vital Signs BP (!) 157/88   Pulse 88   Temp 98.5 F (36.9 C)   Resp 17   LMP 07/06/2014   SpO2 95%   Visual Acuity Right Eye Distance:   Left Eye Distance:   Bilateral Distance:    Right Eye Near:   Left Eye Near:    Bilateral Near:     Physical Exam Vitals reviewed.  Constitutional:      Appearance: She is well-developed.  HENT:     Mouth/Throat:     Mouth: Mucous membranes are moist.     Pharynx: Posterior oropharyngeal erythema present. No oropharyngeal exudate.     Tonsils: No tonsillar exudate.  Cardiovascular:     Rate and Rhythm: Normal rate and regular rhythm.     Heart sounds: Normal heart sounds.  Pulmonary:     Effort: Pulmonary effort is normal.     Breath sounds: Normal breath sounds.  Skin:    General: Skin is warm and dry.  Neurological:     General: No focal deficit  present.     Mental Status: She is alert and oriented to person, place, and time.  Psychiatric:        Mood and Affect: Mood normal.        Behavior: Behavior normal.      UC Treatments / Results  Labs (all labs ordered are listed, but only abnormal results are displayed) Labs Reviewed - No data to display  EKG   Radiology No results found.  Procedures Procedures (including critical care time)  Medications Ordered in UC Medications - No data to display  Initial Impression / Assessment and Plan / UC Course  I have reviewed the triage vital signs and the nursing notes.  Pertinent labs & imaging results that were available during my care of the patient were reviewed by me and considered in my medical decision making (see chart for details).   Patient is afebrile here without recent antipyretics. Satting well on room air. Overall is well appearing, well hydrated, without respiratory distress. Pulmonary exam is unremarkable.  Lungs CTAB.  Pharyngeal erythema is present without peritonsillar exudates.  Suspect viral process for her upper respiratory infection with cough.  Recommending use of OTC medication for symptom control.  Will prescribe benzonatate for daytime relief of cough as well as Promethazine DM for nighttime use.  Final Clinical Impressions(s) / UC Diagnoses   Final diagnoses:  None   Discharge Instructions   None    ED Prescriptions   None    PDMP not reviewed this encounter.   Rose Phi, [place] 02/02/22 1628

## 2022-02-02 NOTE — ED Triage Notes (Signed)
Pt. Presents to UC w/ c/o a cough and sore throat for the past 5 days.

## 2022-02-02 NOTE — Discharge Instructions (Addendum)
Follow up here or with your primary care provider if your symptoms are worsening or not improving.     

## 2022-02-11 ENCOUNTER — Telehealth: Payer: BC Managed Care – PPO | Admitting: Family

## 2022-02-11 DIAGNOSIS — U071 COVID-19: Secondary | ICD-10-CM

## 2022-02-11 MED ORDER — ALBUTEROL SULFATE HFA 108 (90 BASE) MCG/ACT IN AERS: 2.0000 | INHALATION_SPRAY | Freq: Four times a day (QID) | RESPIRATORY_TRACT | 0 refills | Status: DC | PRN

## 2022-02-11 MED ORDER — FLUTICASONE PROPIONATE 50 MCG/ACT NA SUSP: 2.0000 | Freq: Every day | NASAL | 6 refills | Status: DC

## 2022-02-11 MED ORDER — BENZONATATE 100 MG PO CAPS: 100.0000 mg | ORAL_CAPSULE | Freq: Three times a day (TID) | ORAL | 0 refills | Status: DC | PRN

## 2022-02-11 MED ORDER — NIRMATRELVIR/RITONAVIR (PAXLOVID)TABLET: 3.0000 | ORAL_TABLET | Freq: Two times a day (BID) | ORAL | 0 refills | Status: AC

## 2022-02-11 MED ORDER — MOLNUPIRAVIR EUA 200MG CAPSULE: 4.0000 | ORAL_CAPSULE | Freq: Two times a day (BID) | ORAL | 0 refills | Status: AC

## 2022-02-11 NOTE — Addendum Note (Signed)
Addended by: Evelina Dun A on: 02/11/2022 04:48 PM   Modules accepted: Orders

## 2022-02-11 NOTE — Progress Notes (Signed)
  E-Visit  for Positive Covid Test Result  We are sorry you are not feeling well. We are here to help!  You have tested positive for COVID-19, meaning that you were infected with the novel coronavirus and could give the virus to others.  It is vitally important that you stay home so you do not spread it to others.      Please continue isolation at home, for at least 10 days since the start of your symptoms and until you have had 24 hours with no fever (without taking a fever reducer) and with improving of symptoms.  If you have no symptoms but tested positive (or all symptoms resolve after 5 days and you have no fever) you can leave your house but continue to wear a mask around others for an additional 5 days. If you have a fever,continue to stay home until you have had 24 hours of no fever. Most cases improve 5-10 days from onset but we have seen a small number of patients who have gotten worse after the 10 days.  Please be sure to watch for worsening symptoms and remain taking the proper precautions.   Go to the nearest hospital ED for assessment if fever/cough/breathlessness are severe or illness seems like a threat to life.    The following symptoms may appear 2-14 days after exposure: Fever Cough Shortness of breath or difficulty breathing Chills Repeated shaking with chills Muscle pain Headache Sore throat New loss of taste or smell Fatigue Congestion or runny nose Nausea or vomiting Diarrhea  You have been enrolled in MyChart Home Monitoring for COVID-19. Daily you will receive a questionnaire within the MyChart website. Our COVID-19 response team will be monitoring your responses daily.  You can use medication such as prescription cough medication called Tessalon Perles 100 mg. You may take 1-2 capsules every 8 hours as needed for cough,  prescription inhaler called Albuterol MDI 90 mcg /actuation 2 puffs every 4 hours as needed for shortness of breath, wheezing, cough, and  prescription for Fluticasone nasal spray 2 sprays in each nostril one time per day  You may also take acetaminophen (Tylenol) as needed for fever.  HOME CARE: Only take medications as instructed by your medical team. Drink plenty of fluids and get plenty of rest. A steam or ultrasonic humidifier can help if you have congestion.   GET HELP RIGHT AWAY IF YOU HAVE EMERGENCY WARNING SIGNS.  Call 911 or proceed to your closest emergency facility if: You develop worsening high fever. Trouble breathing Bluish lips or face Persistent pain or pressure in the chest New confusion Inability to wake or stay awake You cough up blood. Your symptoms become more severe Inability to hold down food or fluids  This list is not all possible symptoms. Contact your medical provider for any symptoms that are severe or concerning to you.    Your e-visit answers were reviewed by a board certified advanced clinical practitioner to complete your personal care plan.  Depending on the condition, your plan could have included both over the counter or prescription medications.  If there is a problem please reply once you have received a response from your provider.  Your safety is important to us.  If you have drug allergies check your prescription carefully.    You can use MyChart to ask questions about today's visit, request a non-urgent call back, or ask for a work or school excuse for 24 hours related to this e-Visit. If it has   been greater than 24 hours you will need to follow up with your provider, or enter a new e-Visit to address those concerns. You will get an e-mail in the next two days asking about your experience.  I hope that your e-visit has been valuable and will speed your recovery. Thank you for using e-visits.    Approximately 5 minutes was spent documenting and reviewing patient's chart.    

## 2022-02-11 NOTE — Patient Instructions (Signed)

## 2022-02-11 NOTE — Progress Notes (Signed)
Virtual Visit Consent   Misty Rogers, you are scheduled for a virtual visit with a [facility] provider today. Just as with appointments in the office, your consent must be obtained to participate. Your consent will be active for this visit and any virtual visit you may have with one of our providers in the next 365 days. If you have a MyChart account, a copy of this consent can be sent to you electronically.  As this is a virtual visit, video technology does not allow for your provider to perform a traditional examination. This may limit your provider's ability to fully assess your condition. If your provider identifies any concerns that need to be evaluated in person or the need to arrange testing (such as labs, EKG, etc.), we will make arrangements to do so. Although advances in technology are sophisticated, we cannot ensure that it will always work on either your end or our end. If the connection with a video visit is poor, the visit may have to be switched to a telephone visit. With either a video or telephone visit, we are not always able to ensure that we have a secure connection.  By engaging in this virtual visit, you consent to the provision of healthcare and authorize for your insurance to be billed (if applicable) for the services provided during this visit. Depending on your insurance coverage, you may receive a charge related to this service.  I need to obtain your verbal consent now. Are you willing to proceed with your visit today? Misty Rogers has provided verbal consent on 02/11/2022 for a virtual visit (video or telephone). Evelina Dun, FNP  Date: 02/11/2022 2:56 PM  Virtual Visit via Video Note   I, Evelina Dun, connected with  Misty Rogers  ([id number], [birth year]) on 02/11/22 at  3:15 PM EST by a video-enabled telemedicine application and verified that I am speaking with the correct person using two identifiers.  Location: Patient: Virtual Visit Location  Patient: Home Provider: Virtual Visit Location Provider: Home Office   I discussed the limitations of evaluation and management by telemedicine and the availability of in person appointments. The patient expressed understanding and agreed to proceed.    History of Present Illness: Misty Rogers is a 56 y.o. who identifies as a female who was assigned female at birth, and is being seen today for COVID. She reports her symptoms started yesterday and took a test that was positive.   HPI: Cough This is a new problem. The current episode started yesterday. The problem has been unchanged. The problem occurs every few minutes. The cough is Non-productive. Associated symptoms include chills, ear congestion, ear pain, a fever, myalgias and nasal congestion. Pertinent negatives include no shortness of breath or wheezing. She has tried OTC inhaler for the symptoms.    Problems:  Patient Active Problem List   Diagnosis Date Noted   Herpes labialis 12/29/2020   Other insomnia 11/24/2019   Nocturia 11/24/2019   HLD (hyperlipidemia) 10/21/2018   Anxiety and depression 02/15/2015   Peri-menopause 11/29/2014   Hypothyroidism 01/20/2014    Allergies: No Known Allergies Medications:  Current Outpatient Medications:    molnupiravir EUA (LAGEVRIO) 200 mg CAPS capsule, Take 4 capsules (800 mg total) by mouth 2 (two) times daily for 5 days., Disp: 40 capsule, Rfl: 0   albuterol (VENTOLIN HFA) 108 (90 Base) MCG/ACT inhaler, Inhale 2 puffs into the lungs every 6 (six) hours as needed for wheezing or shortness of breath., Disp: 8 g,  Rfl: 0   azithromycin (ZITHROMAX Z-PAK) 250 MG tablet, Take 1 tablet (250 mg total) by mouth daily. Take 2 tablets on the first day of treatment. Then take 1 tablet per day for the next four days., Disp: 6 tablet, Rfl: 0   benzonatate (TESSALON PERLES) 100 MG capsule, Take 1 capsule (100 mg total) by mouth 3 (three) times daily as needed., Disp: 20 capsule, Rfl: 0   citalopram  (CELEXA) 20 MG tablet, Take 1 tablet (20 mg total) by mouth daily., Disp: 90 tablet, Rfl: 3   fluticasone (FLONASE) 50 MCG/ACT nasal spray, Place 2 sprays into both nostrils daily., Disp: 16 g, Rfl: 6   levothyroxine (SYNTHROID) 25 MCG tablet, Take 1qd (Must have labs and virtual visit for future fills), Disp: 90 tablet, Rfl: 3   promethazine-dextromethorphan (PROMETHAZINE-DM) 6.25-15 MG/5ML syrup, Take 5 mLs by mouth at bedtime., Disp: 70 mL, Rfl: 0   valACYclovir (VALTREX) 1000 MG tablet, Take 2 pills every 12 hours for 1 day at the start of cold sore, Disp: 30 tablet, Rfl: 1  Observations/Objective: Patient is well-developed, well-nourished in no acute distress.  Resting comfortably  at home.  Head is normocephalic, atraumatic.  No labored breathing.  Speech is clear and coherent with logical content.  Patient is alert and oriented at baseline.  Coarse cough  Assessment and Plan: 1. COVID-19 - molnupiravir EUA (LAGEVRIO) 200 mg CAPS capsule; Take 4 capsules (800 mg total) by mouth 2 (two) times daily for 5 days.  Dispense: 40 capsule; Refill: 0  COVID positive, rest, force fluids, tylenol as needed, Quarantine for at least 5 days and you are fever free, then must wear a mask out in public from day 2-92, report any worsening symptoms such as increased shortness of breath, swelling, or continued high fevers. Possible adverse effects discussed with antivirals.    Follow Up Instructions: I discussed the assessment and treatment plan with the patient. The patient was provided an opportunity to ask questions and all were answered. The patient agreed with the plan and demonstrated an understanding of the instructions.  A copy of instructions were sent to the patient via MyChart unless otherwise noted below.     The patient was advised to call back or seek an in-person evaluation if the symptoms worsen or if the condition fails to improve as anticipated.  Time:  I spent 6 minutes with the  patient via telehealth technology discussing the above problems/concerns.    Evelina Dun, FNP

## 2022-05-24 ENCOUNTER — Ambulatory Visit: Payer: BC Managed Care – PPO | Admitting: Dermatology

## 2022-06-05 ENCOUNTER — Other Ambulatory Visit: Payer: Self-pay | Admitting: Internal Medicine

## 2022-06-05 DIAGNOSIS — Z1231 Encounter for screening mammogram for malignant neoplasm of breast: Secondary | ICD-10-CM

## 2022-07-03 ENCOUNTER — Ambulatory Visit
Admission: RE | Admit: 2022-07-03 | Discharge: 2022-07-03 | Disposition: A | Discharge status: Home / Self Care | Payer: BC Managed Care – PPO | Source: Ambulatory Visit | Attending: Internal Medicine | Admitting: Internal Medicine

## 2022-07-03 DIAGNOSIS — Z1231 Encounter for screening mammogram for malignant neoplasm of breast: Secondary | ICD-10-CM | POA: Insufficient documentation

## 2022-10-02 ENCOUNTER — Ambulatory Visit: Payer: BC Managed Care – PPO | Admitting: Dermatology

## 2022-10-02 ENCOUNTER — Encounter: Payer: Self-pay | Admitting: Dermatology

## 2022-10-02 VITALS — BP 172/106 | HR 77 | Resp 20 | Ht 67.0 in | Wt 200.0 lb

## 2022-10-02 DIAGNOSIS — Z1283 Encounter for screening for malignant neoplasm of skin: Secondary | ICD-10-CM

## 2022-10-02 DIAGNOSIS — L729 Follicular cyst of the skin and subcutaneous tissue, unspecified: Secondary | ICD-10-CM

## 2022-10-02 DIAGNOSIS — W908XXA Exposure to other nonionizing radiation, initial encounter: Secondary | ICD-10-CM

## 2022-10-02 DIAGNOSIS — L814 Other melanin hyperpigmentation: Secondary | ICD-10-CM | POA: Diagnosis not present

## 2022-10-02 DIAGNOSIS — D229 Melanocytic nevi, unspecified: Secondary | ICD-10-CM

## 2022-10-02 DIAGNOSIS — D2261 Melanocytic nevi of right upper limb, including shoulder: Secondary | ICD-10-CM | POA: Diagnosis not present

## 2022-10-02 DIAGNOSIS — L821 Other seborrheic keratosis: Secondary | ICD-10-CM

## 2022-10-02 DIAGNOSIS — L578 Other skin changes due to chronic exposure to nonionizing radiation: Secondary | ICD-10-CM

## 2022-10-02 NOTE — Patient Instructions (Signed)

## 2022-10-02 NOTE — Progress Notes (Signed)
   Follow-Up Visit   Subjective  Misty Rogers is a 57 y.o. female who presents for the following: TBSE  Patient present today for follow up visit for TBSE. Patient was last evaluated on 05/22/2021.Patient reports no medication changes. Patient reports throughout her lifetime she has had minimal sun exposure. Currently, patient reports if she has excessive sun exposure, she does apply sunscreen and/or wears protective coverings. Patient reports she has a spot on her scalp   The following portions of the chart were reviewed this encounter and updated as appropriate: medications, allergies, medical history  Review of Systems:  No other skin or systemic complaints except as noted in HPI or Assessment and Plan.  Objective  Well appearing patient in no apparent distress; mood and affect are within normal limits.  A full examination was performed including scalp, head, eyes, ears, nose, lips, neck, chest, axillae, abdomen, back, buttocks, bilateral upper extremities, bilateral lower extremities, hands, feet, fingers, toes, fingernails, and toenails. All findings within normal limits unless otherwise noted below.   Relevant exam findings are noted in the Assessment and Plan.       Assessment & Plan   CYST Exam: Subcutaneous nodule at Right anterior vertex scalp 1cm  Benign-appearing. Exam most consistent with an epidermal inclusion cyst. Discussed that a cyst is a benign growth that can grow over time and sometimes get irritated or inflamed. Recommend observation if it is not bothersome. Discussed option of surgical excision to remove it if it is growing, symptomatic, or other changes noted. Please call for new or changing lesions so they can be evaluated.  Treatment plan: - Recommended cyst excision, Patient advised to schedule in the near future  LENTIGINES, SEBORRHEIC KERATOSES, HEMANGIOMAS - Benign normal skin lesions - Benign-appearing - Call for any changes  MELANOCYTIC  NEVI Exam: 1mm brown macule right Volar Wrist  Treatment Plan: - Benign appearing on exam today - Observation - Call clinic for new or changing moles - Recommend daily use of broad spectrum spf 30+ sunscreen to sun-exposed areas.   ACTINIC DAMAGE - Chronic condition, secondary to cumulative UV/sun exposure - diffuse scaly erythematous macules with underlying dyspigmentation - Recommend daily broad spectrum sunscreen SPF 30+ to sun-exposed areas, reapply every 2 hours as needed.  - Staying in the shade or wearing long sleeves, sun glasses (UVA+UVB protection) and wide brim hats (4-inch brim around the entire circumference of the hat) are also recommended for sun protection.  - Call for new or changing lesions.  SKIN CANCER SCREENING PERFORMED TODAY  Return in 1 year (on 10/02/2023) for TBSE.  Documentation: I have reviewed the above documentation for accuracy and completeness, and I agree with the above.  Stasia Cavalier, am acting as scribe for Armida Sans, MD.  Armida Sans, MD Documentation: I have reviewed the above documentation for accuracy and completeness, and I agree with the above.  Armida Sans, MD

## 2022-10-11 ENCOUNTER — Encounter: Payer: Self-pay | Admitting: Dermatology

## 2022-12-11 ENCOUNTER — Encounter: Payer: Self-pay | Admitting: Dermatology

## 2022-12-11 ENCOUNTER — Ambulatory Visit: Payer: BC Managed Care – PPO | Admitting: Dermatology

## 2022-12-11 VITALS — BP 150/84

## 2022-12-11 DIAGNOSIS — D492 Neoplasm of unspecified behavior of bone, soft tissue, and skin: Secondary | ICD-10-CM

## 2022-12-11 DIAGNOSIS — L7211 Pilar cyst: Secondary | ICD-10-CM

## 2022-12-11 MED ORDER — MUPIROCIN 2 % EX OINT: 1.0000 | TOPICAL_OINTMENT | Freq: Every day | CUTANEOUS | 1 refills | Status: DC

## 2022-12-11 NOTE — Patient Instructions (Addendum)
Wound Care Instructions  On the day following your surgery, you should begin doing daily dressing changes: Remove the old dressing and discard it. Cleanse the wound gently with tap water. This may be done in the shower or by placing a wet gauze pad directly on the wound and letting it soak for several minutes. It is important to gently remove any dried blood from the wound in order to encourage healing. This may be done by gently rolling a moistened Q-tip on the dried blood. Do not pick at the wound. If the wound should start to bleed, continue cleaning the wound, then place a moist gauze pad on the wound and hold pressure for a few minutes.  Make sure you then dry the skin surrounding the wound completely or the tape will not stick to the skin. Do not use cotton balls on the wound. After the wound is clean and dry, apply the ointment gently with a Q-tip. Cut a non-stick pad to fit the size of the wound. Lay the pad flush to the wound. If the wound is draining, you may want to reinforce it with a small amount of gauze on top of the non-stick pad for a little added compression to the area. Use the tape to seal the area completely. Select from the following with respect to your individual situation: If your wound has been stitched closed: continue the above steps 1-8 at least daily until your sutures are removed. If your wound has been left open to heal: continue steps 1-8 at least daily for the first 3-4 weeks. We would like for you to take a few extra precautions for at least the next week. Sleep with your head elevated on pillows if our wound is on your head. Do not bend over or lift heavy items to reduce the chance of elevated blood pressure to the wound Do not participate in particularly strenuous activities.   Below is a list of dressing supplies you might need.  Cotton-tipped applicators - Q-tips Gauze pads (2x2 and/or 4x4) - All-Purpose Sponges Non-stick dressing material - Telfa Tape -  Paper or Hypafix New and clean tube of petroleum jelly - Vaseline    Comments on Post-Operative Period Slight swelling and redness often appear around the wound. This is normal and will disappear within several days following the surgery. The healing wound will drain a brownish-red-yellow discharge during healing. This is a normal phase of wound healing. As the wound begins to heal, the drainage may increase in amount. Again, this drainage is normal. Notify us if the drainage becomes persistently bloody, excessively swollen, or intensely painful or develops a foul odor or red streaks.  If you should experience mild discomfort during the healing phase, you may take an aspirin-free medication such as Tylenol (acetaminophen). Notify us if the discomfort is severe or persistent. Avoid alcoholic beverages when taking pain medicine.  In Case of Wound Hemorrhage A wound hemorrhage is when the bandage suddenly becomes soaked with bright red blood and flows profusely. If this happens, sit down or lie down with your head elevated. If the wound has a dressing on it, do not remove the dressing. Apply pressure to the existing gauze. If the wound is not covered, use a gauze pad to apply pressure and continue applying the pressure for 20 minutes without peeking. DO NOT COVER THE WOUND WITH A LARGE TOWEL OR WASH CLOTH. Release your hand from the wound site but do not remove the dressing. If the bleeding has stopped,  gently clean around the wound. Leave the dressing in place for 24 hours if possible. This wait time allows the blood vessels to close off so that you do not spark a new round of bleeding by disrupting the newly clotted blood vessels with an immediate dressing change. If the bleeding does not subside, continue to hold pressure. If matters are out of your control, contact an After Hours clinic or go to the Emergency Room.  Due to recent changes in healthcare laws, you may see results of your pathology and/or  laboratory studies on MyChart before the doctors have had a chance to review them. We understand that in some cases there may be results that are confusing or concerning to you. Please understand that not all results are received at the same time and often the doctors may need to interpret multiple results in order to provide you with the best plan of care or course of treatment. Therefore, we ask that you please give [place] 2 business days to thoroughly review all your results before contacting the office for clarification. Should we see a critical lab result, you will be contacted sooner.   If You Need Anything After Your Visit  If you have any questions or concerns for your doctor, please call our main line at [phone] and press option 4 to reach your doctor's medical assistant. If no one answers, please leave a voicemail as directed and we will return your call as soon as possible. Messages left after 4 pm will be answered the following business day.   You may also send [place] a message via MyChart. We typically respond to MyChart messages within 1-2 business days.  For prescription refills, please ask your pharmacy to contact our office. Our fax number is [phone].  If you have an urgent issue when the clinic is closed that cannot wait until the next business day, you can page your doctor at the number below.    Please note that while we do our best to be available for urgent issues outside of office hours, we are not available 24/7.   If you have an urgent issue and are unable to reach [place], you may choose to seek medical care at your doctor's office, retail clinic, urgent care center, or emergency room.  If you have a medical emergency, please immediately call 911 or go to the emergency department.  Pager Numbers  - Dr. Gwen Pounds: [phone]  - Dr. Roseanne Reno: [phone]  - Dr. Katrinka Blazing: [phone]   In the event of inclement weather, please call our main line at [phone] for an  update on the status of any delays or closures.  Dermatology Medication Tips: Please keep the boxes that topical medications come in in order to help keep track of the instructions about where and how to use these. Pharmacies typically print the medication instructions only on the boxes and not directly on the medication tubes.   If your medication is too expensive, please contact our office at [phone] option 4 or send [place] a message through MyChart.   We are unable to tell what your co-pay for medications will be in advance as this is different depending on your insurance coverage. However, we may be able to find a substitute medication at lower cost or fill out paperwork to get insurance to cover a needed medication.   If a prior authorization is required to get your medication covered by your insurance company, please allow [place] 1-2 business days to complete this process.  Drug prices often vary depending on where the prescription is filled and some pharmacies may offer cheaper prices.  The website www.goodrx.com contains coupons for medications through different pharmacies. The prices here do not account for what the cost may be with help from insurance (it may be cheaper with your insurance), but the website can give you the price if you did not use any insurance.  - You can print the associated coupon and take it with your prescription to the pharmacy.  - You may also stop by our office during regular business hours and pick up a GoodRx coupon card.  - If you need your prescription sent electronically to a different pharmacy, notify our office through [facility] or by phone at [phone] option 4.     Si Usted Necesita Algo Despus de Su Visita  Tambin puede enviarnos un mensaje a travs de Clinical cytogeneticist. Por lo general respondemos a los mensajes de MyChart en el transcurso de 1 a 2 das hbiles.  Para renovar recetas, por favor pida a su farmacia que se ponga en contacto con  nuestra oficina. Annie Sable de fax es [place] [place] [phone].  Si tiene un asunto urgente cuando la clnica est cerrada y que no puede esperar hasta el siguiente da hbil, puede llamar/localizar a su doctor(a) al nmero que aparece a continuacin.   Por favor, tenga en cuenta que aunque hacemos todo lo posible para estar disponibles para asuntos urgentes fuera del horario de [place], no estamos disponibles las 24 horas del da, los 7 [address]  [address] de la [place].   Si tiene un problema urgente y no puede comunicarse con nosotros, puede optar por buscar atencin mdica  en el consultorio de su doctor(a), en una clnica privada, en un centro de atencin urgente o en una sala de emergencias.  Si tiene Engineer, drilling, por favor llame inmediatamente al 911 o vaya a la sala de emergencias.  Nmeros de bper  - Dr. Gwen Pounds: [phone]  - Dra. Roseanne Reno: [phone]  - Dr. Katrinka Blazing: [phone]   En caso de inclemencias del tiempo, por favor llame a Lacy Duverney principal al [phone] para una actualizacin sobre el [place] de cualquier retraso o cierre.  Consejos para la medicacin en dermatologa: Por favor, guarde las cajas en las que vienen los medicamentos de uso tpico para ayudarle a seguir las instrucciones sobre dnde y cmo usarlos. Las farmacias generalmente imprimen las instrucciones del medicamento slo en las cajas y no directamente en los tubos del [place].   Si su medicamento es muy caro, por favor, pngase en contacto con Rolm Gala llamando al [phone] y presione la opcin 4 o envenos un mensaje a travs de Clinical cytogeneticist.   No podemos decirle cul ser su copago por los medicamentos por adelantado ya que esto es diferente dependiendo de la cobertura de su seguro. Sin embargo, es posible que podamos encontrar un medicamento sustituto a Audiological scientist un formulario para que el seguro cubra el medicamento que se considera necesario.   Si se requiere una autorizacin  previa para que su compaa de seguros [place] su medicamento, por favor permtanos de 1 a 2 das hbiles para completar [address]  Los precios de los medicamentos varan con frecuencia dependiendo del Environmental consultant de dnde se surte la receta y alguna farmacias pueden ofrecer precios ms baratos.  El sitio web www.goodrx.com tiene cupones para medicamentos de Health and safety inspector. Los precios aqu no tienen en cuenta lo que podra costar con la ayuda del seguro (puede ser  ms barato con su seguro), pero el sitio web puede darle el precio si no Visual merchandiser.  - Puede imprimir el cupn correspondiente y llevarlo con su receta a la farmacia.  - Tambin puede pasar por nuestra oficina durante el horario de atencin regular y Education officer, museum una tarjeta de cupones de GoodRx.  - Si necesita que su receta se enve electrnicamente a una farmacia diferente, informe a nuestra oficina a travs de MyChart de [place] o por telfono llamando al [phone] y presione la opcin 4.

## 2022-12-11 NOTE — Progress Notes (Signed)
   Follow-Up Visit   Subjective  Misty Rogers is a 57 y.o. female who presents for the following: cyst vs other R ant vertex scalp, pt presents for excision The patient has spots, moles and lesions to be evaluated, some may be new or changing and the patient may have concern these could be cancer.   The following portions of the chart were reviewed this encounter and updated as appropriate: medications, allergies, medical history  Review of Systems:  No other skin or systemic complaints except as noted in HPI or Assessment and Plan.  Objective  Well appearing patient in no apparent distress; mood and affect are within normal limits.   A focused examination was performed of the following areas: scalp  Relevant exam findings are noted in the Assessment and Plan.  R ant vertex scalp Cystic pap 1.5cm    Assessment & Plan     Neoplasm of skin R ant vertex scalp  Skin excision  Lesion length (cm):  1.5 Lesion width (cm):  1.5 Margin per side (cm):  0 Total excision diameter (cm):  1.5 Informed consent: discussed and consent obtained   Timeout: patient name, date of birth, surgical site, and procedure verified   Procedure prep:  Patient was prepped and draped in usual sterile fashion Prep type:  Isopropyl alcohol and povidone-iodine Anesthesia: the lesion was anesthetized in a standard fashion   Anesthetic:  1% lidocaine w/ epinephrine 1-100,000 buffered w/ 8.4% NaHCO3 (3cc lido w/ epi) Instrument used comment:  #15c blade Hemostasis achieved with: pressure   Hemostasis achieved with comment:  Electrocautery Outcome: patient tolerated procedure well with no complications   Post-procedure details: sterile dressing applied and wound care instructions given   Dressing type: bandage and pressure dressing (Mupirocin)    Skin repair Complexity:  Complex Final length (cm):  1.5 Reason for type of repair: reduce tension to allow closure, reduce the risk of dehiscence,  infection, and necrosis, reduce subcutaneous dead space and avoid a hematoma, allow closure of the large defect, preserve normal anatomy, preserve normal anatomical and functional relationships and enhance both functionality and cosmetic results   Undermining: area extensively undermined   Undermining comment:  Undermining Defect 1.5cm Subcutaneous layers (deep stitches):  Suture size:  4-0 Suture type: Vicryl (polyglactin 910)   Subcutaneous suture technique: Inverted Dermal. Fine/surface layer approximation (top stitches):  Suture size:  3-0 Suture type: nylon   Stitches: horizontal mattress, simple interrupted and simple running   Stitches comment:  1 horizontal mattress, 2 simple interrupted Suture removal (days):  7 Hemostasis achieved with: pressure Outcome: patient tolerated procedure well with no complications   Post-procedure details: sterile dressing applied and wound care instructions given   Dressing type: bandage, pressure dressing and bacitracin (Mupirocin)    mupirocin ointment (BACTROBAN) 2 % Apply 1 Application topically daily. Qd to excision site  Specimen 1 - Surgical pathology Differential Diagnosis: D48.5 Cyst vs other  Check Margins: No Cystic pap 1.5cm  Cyst vs other excised today Start Mupirocin oint qd to excision site    Return in about 1 week (around 12/18/2022) for suture removal.  I, Ardis Rowan, RMA, am acting as scribe for Armida Sans, MD .   Documentation: I have reviewed the above documentation for accuracy and completeness, and I agree with the above.  Armida Sans, MD

## 2022-12-12 ENCOUNTER — Telehealth: Payer: Self-pay

## 2022-12-12 NOTE — Telephone Encounter (Signed)
Patient doing fine after yesterdays surgery./sh 

## 2022-12-13 LAB — SURGICAL PATHOLOGY

## 2022-12-18 ENCOUNTER — Ambulatory Visit (INDEPENDENT_AMBULATORY_CARE_PROVIDER_SITE_OTHER): Payer: BC Managed Care – PPO | Admitting: Dermatology

## 2022-12-18 ENCOUNTER — Encounter: Payer: Self-pay | Admitting: Dermatology

## 2022-12-18 VITALS — BP 139/82

## 2022-12-18 DIAGNOSIS — L7211 Pilar cyst: Secondary | ICD-10-CM

## 2022-12-18 DIAGNOSIS — L729 Follicular cyst of the skin and subcutaneous tissue, unspecified: Secondary | ICD-10-CM

## 2022-12-18 NOTE — Progress Notes (Signed)
   Follow-Up Visit   Subjective  DOLORES OSNESS is a 57 y.o. female who presents for the following: Cyst bx proven, vertex, scalp, pt presents for suture removal The patient has spots, moles and lesions to be evaluated, some may be new or changing and the patient may have concern these could be cancer.   The following portions of the chart were reviewed this encounter and updated as appropriate: medications, allergies, medical history  Review of Systems:  No other skin or systemic complaints except as noted in HPI or Assessment and Plan.  Objective  Well appearing patient in no apparent distress; mood and affect are within normal limits.   A focused examination was performed of the following areas: scalp  Relevant exam findings are noted in the Assessment and Plan.    Assessment & Plan   PILAR CYST, bx proven Vertex scalp Exam: healing excision site  Treatment Plan: Healing excision site  Encounter for Removal of Sutures - Incision site at the vertex scalp is clean, dry and intact - Wound cleansed, sutures removed, wound cleansed and steri strips applied.  - Discussed pathology results showing pilar cyst  - Patient advised to keep steri-strips dry until they fall off. - Scars remodel for a full year. - Once steri-strips fall off, patient can apply over-the-counter silicone scar cream each night to help with scar remodeling if desired. - Patient advised to call with any concerns or if they notice any new or changing lesions.       Return for as scheduled for TBSE.  I, Ardis Rowan, RMA, am acting as scribe for Armida Sans, MD .   Documentation: I have reviewed the above documentation for accuracy and completeness, and I agree with the above.  Armida Sans, MD

## 2022-12-29 ENCOUNTER — Encounter: Payer: Self-pay | Admitting: Dermatology

## 2023-05-24 ENCOUNTER — Ambulatory Visit
Admission: EM | Admit: 2023-05-24 | Discharge: 2023-05-24 | Disposition: A | Discharge status: Home / Self Care | Attending: Physician Assistant | Admitting: Physician Assistant

## 2023-05-24 DIAGNOSIS — J029 Acute pharyngitis, unspecified: Secondary | ICD-10-CM

## 2023-05-24 DIAGNOSIS — R051 Acute cough: Secondary | ICD-10-CM

## 2023-05-24 DIAGNOSIS — J019 Acute sinusitis, unspecified: Secondary | ICD-10-CM | POA: Diagnosis not present

## 2023-05-24 MED ORDER — PROMETHAZINE-DM 6.25-15 MG/5ML PO SYRP: 5.0000 mL | ORAL_SOLUTION | Freq: Four times a day (QID) | ORAL | 0 refills | Status: DC | PRN

## 2023-05-24 MED ORDER — IPRATROPIUM BROMIDE 0.06 % NA SOLN: 2.0000 | Freq: Four times a day (QID) | NASAL | 0 refills | Status: AC

## 2023-05-24 MED ORDER — PROMETHAZINE-DM 6.25-15 MG/5ML PO SYRP: 5.0000 mL | ORAL_SOLUTION | Freq: Four times a day (QID) | ORAL | 0 refills | Status: AC | PRN

## 2023-05-24 MED ORDER — CEFDINIR 300 MG PO CAPS: 300.0000 mg | ORAL_CAPSULE | Freq: Two times a day (BID) | ORAL | 0 refills | Status: DC

## 2023-05-24 MED ORDER — CEFDINIR 300 MG PO CAPS: 300.0000 mg | ORAL_CAPSULE | Freq: Two times a day (BID) | ORAL | 0 refills | Status: AC

## 2023-05-24 MED ORDER — IPRATROPIUM BROMIDE 0.06 % NA SOLN: 2.0000 | Freq: Four times a day (QID) | NASAL | 0 refills | Status: DC

## 2023-05-24 NOTE — ED Provider Notes (Signed)
 [facility]    CSN: [id number] Arrival date & time: 05/24/23  1205      History   Chief Complaint Chief Complaint  Patient presents with   Cough   Headache   Nasal Congestion    HPI Misty Rogers is a 58 y.o. female presenting for 1-1/2-week history of sore throat, nasal congestion, sinus pressure, dry cough, headaches.  Denies fever.  Has been fatigued.  Denies chest pain, wheezing, shortness of breath, vomiting or diarrhea.  Has been taking OTC meds without relief.  States that she feels like her symptoms have gotten worse.  HPI  Past Medical History:  Diagnosis Date   Anxiety    History of dysplastic nevus 12/10/2018    left upper back 4.0cm lat to spine and left chest parasternal   History of dysplastic nevus 05/01/2017   left axilla   History of dysplastic nevus 04/18/2016   right axilla   History of dysplastic nevus 05/22/2021   R med inframammary - moderate    Patient Active Problem List   Diagnosis Date Noted   Depression, major, in remission (HCC) 12/12/2021   Herpes labialis 12/29/2020   Other insomnia 11/24/2019   Nocturia 11/24/2019   Cellulitis 07/14/2019   Ganglion cyst of right foot 06/22/2019   HLD (hyperlipidemia) 10/21/2018   Anxiety and depression 02/15/2015   Peri-menopause 11/29/2014   Hypothyroidism 01/20/2014    Past Surgical History:  Procedure Laterality Date   CERVICAL BIOPSY     TOE SURGERY      OB History   No obstetric history on file.      Home Medications    Prior to Admission medications   Medication Sig Start Date End Date Taking? Authorizing Provider  citalopram (CELEXA) 20 MG tablet Take 1 tablet (20 mg total) by mouth daily. 12/29/20  Yes Gweneth Dimitri, MD  levothyroxine (SYNTHROID) 25 MCG tablet Take 1qd (Must have labs and virtual visit for future fills) 12/29/20  Yes Gweneth Dimitri, MD  valACYclovir (VALTREX) 1000 MG tablet Take 2 pills every 12 hours for 1 day at the start of cold sore 12/29/20   Yes Gweneth Dimitri, MD  cefdinir (OMNICEF) 300 MG capsule Take 1 capsule (300 mg total) by mouth 2 (two) times daily for 7 days. 05/24/23 05/31/23  Eusebio Friendly B, PA-C  ipratropium (ATROVENT) 0.06 % nasal spray Place 2 sprays into both nostrils 4 (four) times daily. 05/24/23   Shirlee Latch, PA-C  promethazine-dextromethorphan (PROMETHAZINE-DM) 6.25-15 MG/5ML syrup Take 5 mLs by mouth 4 (four) times daily as needed. 05/24/23   Shirlee Latch, PA-C    Family History Family History  Problem Relation Age of Onset   Hypertension Father    Seizures Father    Leukemia Father    Ovarian cancer Maternal Aunt 32   Hypertension Paternal Grandmother    Lung cancer Paternal Grandfather        smoker   Breast cancer Neg Hx     Social History Social History   Tobacco Use   Smoking status: Never    Passive exposure: Never   Smokeless tobacco: Never  Substance Use Topics   Alcohol use: Yes    Alcohol/week: 0.0 standard drinks of alcohol    Comment: a few drinks/week   Drug use: No     Allergies   Patient has no known allergies.   Review of Systems Review of Systems  Constitutional:  Positive for fatigue. Negative for chills, diaphoresis and fever.  HENT:  Positive for congestion, rhinorrhea, sinus pressure and sore throat. Negative for ear pain and sinus pain.   Respiratory:  Positive for cough. Negative for shortness of breath and wheezing.   Cardiovascular:  Negative for chest pain.  Gastrointestinal:  Negative for abdominal pain, nausea and vomiting.  Musculoskeletal:  Negative for arthralgias and myalgias.  Skin:  Negative for rash.  Neurological:  Positive for headaches. Negative for weakness.  Hematological:  Negative for adenopathy.     Physical Exam Triage Vital Signs ED Triage Vitals [05/24/23 1252]  Encounter Vitals Group     BP      Systolic BP Percentile      Diastolic BP Percentile      Pulse      Resp      Temp      Temp src      SpO2      Weight       Height      Head Circumference      Peak Flow      Pain Score 6     Pain Loc      Pain Education      Exclude from Growth Chart    No data found.  Updated Vital Signs BP (!) 142/81 (BP Location: Left Arm)   Pulse 91   Temp 99.5 F (37.5 C) (Oral)   Resp 19   LMP 07/06/2014   SpO2 97%      Physical Exam Vitals and nursing note reviewed.  Constitutional:      General: She is not in acute distress.    Appearance: Normal appearance. She is not ill-appearing or toxic-appearing.  HENT:     Head: Normocephalic and atraumatic.     Right Ear: Ear canal and external ear normal. A middle ear effusion is present.     Left Ear: Ear canal and external ear normal. A middle ear effusion is present.     Nose: Congestion present.     Mouth/Throat:     Mouth: Mucous membranes are moist.     Pharynx: Oropharynx is clear. Posterior oropharyngeal erythema present.  Eyes:     General: No scleral icterus.       Right eye: No discharge.        Left eye: No discharge.     Conjunctiva/sclera: Conjunctivae normal.  Cardiovascular:     Rate and Rhythm: Normal rate and regular rhythm.     Heart sounds: Normal heart sounds.  Pulmonary:     Effort: Pulmonary effort is normal. No respiratory distress.     Breath sounds: Normal breath sounds.  Musculoskeletal:     Cervical back: Neck supple.  Skin:    General: Skin is dry.  Neurological:     General: No focal deficit present.     Mental Status: She is alert. Mental status is at baseline.     Motor: No weakness.     Gait: Gait normal.  Psychiatric:        Mood and Affect: Mood normal.        Behavior: Behavior normal.      UC Treatments / Results  Labs (all labs ordered are listed, but only abnormal results are displayed) Labs Reviewed - No data to display  EKG   Radiology No results found.  Procedures Procedures (including critical care time)  Medications Ordered in UC Medications - No data to display  Initial Impression /  Assessment and Plan / UC Course  I have reviewed the triage  vital signs and the nursing notes.  Pertinent labs & imaging results that were available during my care of the patient were reviewed by me and considered in my medical decision making (see chart for details).   58 year old female presents for 1-1/2-week history of sore throat, nasal congestion, sinus pressure, cough, fatigue and headaches.  No chest pain, wheezing or shortness of breath.  She is afebrile.  Overall well-appearing.  No acute distress.  On exam has effusion of bilateral TMs, nasal congestion, significant erythema of posterior pharynx.  Chest clear.  Heart regular rate and rhythm.  Acute sinusitis.  Acute pharyngitis.  Treating at this time with cefdinir.  Patient reports Augmentin makes her sick.  Also sent Atrovent nasal spray and Promethazine DM.  Encouraged increased rest and fluids.  Reviewed return precautions.   Final Clinical Impressions(s) / UC Diagnoses   Final diagnoses:  Acute sinusitis, recurrence not specified, unspecified location  Sore throat  Acute cough     Discharge Instructions      -Start antibiotics for sinus infection and throat infection. - Use nasal spray and cough medicine as directed. - Increase rest and fluids. - Return if fever or worsening symptoms.     ED Prescriptions     Medication Sig Dispense Auth. Provider   cefdinir (OMNICEF) 300 MG capsule  (Status: Discontinued) Take 1 capsule (300 mg total) by mouth 2 (two) times daily for 7 days. 14 capsule Eusebio Friendly B, PA-C   promethazine-dextromethorphan (PROMETHAZINE-DM) 6.25-15 MG/5ML syrup  (Status: Discontinued) Take 5 mLs by mouth 4 (four) times daily as needed. 118 mL Eusebio Friendly B, PA-C   ipratropium (ATROVENT) 0.06 % nasal spray  (Status: Discontinued) Place 2 sprays into both nostrils 4 (four) times daily. 15 mL Eusebio Friendly B, PA-C   cefdinir (OMNICEF) 300 MG capsule Take 1 capsule (300 mg total) by mouth 2 (two)  times daily for 7 days. 14 capsule Eusebio Friendly B, PA-C   ipratropium (ATROVENT) 0.06 % nasal spray Place 2 sprays into both nostrils 4 (four) times daily. 15 mL Eusebio Friendly B, PA-C   promethazine-dextromethorphan (PROMETHAZINE-DM) 6.25-15 MG/5ML syrup Take 5 mLs by mouth 4 (four) times daily as needed. 118 mL Shirlee Latch, PA-C      PDMP not reviewed this encounter.   Shirlee Latch, PA-C 05/24/23 1318

## 2023-05-24 NOTE — Discharge Instructions (Addendum)
-  Start antibiotics for sinus infection and throat infection. - Use nasal spray and cough medicine as directed. - Increase rest and fluids. - Return if fever or worsening symptoms.

## 2023-05-24 NOTE — ED Triage Notes (Signed)
 Sx x 1.5 weeks  Non productive cough Headache Nasal congestion

## 2023-10-22 ENCOUNTER — Encounter: Payer: Self-pay | Admitting: Dermatology

## 2023-10-22 ENCOUNTER — Ambulatory Visit: Payer: BC Managed Care – PPO | Admitting: Dermatology

## 2023-10-22 DIAGNOSIS — Z1283 Encounter for screening for malignant neoplasm of skin: Secondary | ICD-10-CM | POA: Diagnosis not present

## 2023-10-22 DIAGNOSIS — L814 Other melanin hyperpigmentation: Secondary | ICD-10-CM | POA: Diagnosis not present

## 2023-10-22 DIAGNOSIS — W908XXA Exposure to other nonionizing radiation, initial encounter: Secondary | ICD-10-CM

## 2023-10-22 DIAGNOSIS — L578 Other skin changes due to chronic exposure to nonionizing radiation: Secondary | ICD-10-CM | POA: Diagnosis not present

## 2023-10-22 DIAGNOSIS — D229 Melanocytic nevi, unspecified: Secondary | ICD-10-CM

## 2023-10-22 DIAGNOSIS — Z86018 Personal history of other benign neoplasm: Secondary | ICD-10-CM

## 2023-10-22 DIAGNOSIS — D2361 Other benign neoplasm of skin of right upper limb, including shoulder: Secondary | ICD-10-CM

## 2023-10-22 NOTE — Patient Instructions (Signed)

## 2023-10-22 NOTE — Progress Notes (Signed)
   Follow-Up Visit   Subjective  Misty Rogers is a 58 y.o. female who presents for the following: Skin Cancer Screening and Full Body Skin Exam Hx of dysplastic nevi  Hx of cyst at scalp  The patient presents for Total-Body Skin Exam (TBSE) for skin cancer screening and mole check. The patient has spots, moles and lesions to be evaluated, some may be new or changing and the patient may have concern these could be cancer.  The following portions of the chart were reviewed this encounter and updated as appropriate: medications, allergies, medical history  Review of Systems:  No other skin or systemic complaints except as noted in HPI or Assessment and Plan.  Objective  Well appearing patient in no apparent distress; mood and affect are within normal limits.  A full examination was performed including scalp, head, eyes, ears, nose, lips, neck, chest, axillae, abdomen, back, buttocks, bilateral upper extremities, bilateral lower extremities, hands, feet, fingers, toes, fingernails, and toenails. All findings within normal limits unless otherwise noted below.   Relevant physical exam findings are noted in the Assessment and Plan.  Right volar wrist - blue nevus       Assessment & Plan   SKIN CANCER SCREENING PERFORMED TODAY.  ACTINIC DAMAGE - Chronic condition, secondary to cumulative UV/sun exposure - diffuse scaly erythematous macules with underlying dyspigmentation - Recommend daily broad spectrum sunscreen SPF 30+ to sun-exposed areas, reapply every 2 hours as needed.  - Staying in the shade or wearing long sleeves, sun glasses (UVA+UVB protection) and wide brim hats (4-inch brim around the entire circumference of the hat) are also recommended for sun protection.  - Call for new or changing lesions.  LENTIGINES, SEBORRHEIC KERATOSES, HEMANGIOMAS - Benign normal skin lesions - Benign-appearing - Call for any changes Sk at left temple discussed if bothersome can be treated  in future   MELANOCYTIC NEVI Exam: Tan-brown and/or pink-flesh-colored symmetric macules and papules Treatment Plan: Benign appearing on exam today. Recommend observation. Call clinic for new or changing moles. Recommend daily use of broad spectrum spf 30+ sunscreen to sun-exposed areas.    Blue Nevus at Right Volar Wrist Exam: 2 mm blue gray macule at right volar wrist  --  see photos Treatment Plan: Benign-appearing. Stable compared to previous visit. Observation.  Call clinic for new or changing moles.  Recommend daily use of broad spectrum spf 30+ sunscreen to sun-exposed areas.  Discussed if changes would recommend removal    HISTORY OF DYSPLASTIC NEVUS 05/22/2021 right med inframammary - moderate 12/10/2018 left upper back 4 cm lateral to spine and left chest parasternal 05/01/2017 left axilla 04/18/2016 - right axilla No evidence of recurrence today Recommend regular full body skin exams Recommend daily broad spectrum sunscreen SPF 30+ to sun-exposed areas, reapply every 2 hours as needed.  Call if any new or changing lesions are noted between office visits   Return in about 1 year (around 10/21/2024) for TBSE.  IEleanor Blush, CMA, am acting as scribe for Alm Rhyme, MD.   Documentation: I have reviewed the above documentation for accuracy and completeness, and I agree with the above.  Alm Rhyme, MD

## 2024-10-21 ENCOUNTER — Ambulatory Visit: Admitting: Dermatology
